# Patient Record
Sex: Male | Born: 1970 | Race: White | Hispanic: No | Marital: Married | Smoking: Current every day smoker
Health system: Southern US, Community
[De-identification: ages and names within clinical notes are randomized; demographics above are authoritative.]

## PROBLEM LIST (undated history)

## (undated) DIAGNOSIS — K219 Gastro-esophageal reflux disease without esophagitis: Secondary | ICD-10-CM

## (undated) DIAGNOSIS — M659 Synovitis and tenosynovitis, unspecified: Secondary | ICD-10-CM

## (undated) DIAGNOSIS — F329 Major depressive disorder, single episode, unspecified: Secondary | ICD-10-CM

## (undated) DIAGNOSIS — F32A Depression, unspecified: Secondary | ICD-10-CM

## (undated) DIAGNOSIS — R569 Unspecified convulsions: Secondary | ICD-10-CM

## (undated) DIAGNOSIS — M65972 Unspecified synovitis and tenosynovitis, left ankle and foot: Secondary | ICD-10-CM

## (undated) DIAGNOSIS — F419 Anxiety disorder, unspecified: Secondary | ICD-10-CM

## (undated) HISTORY — PX: APPENDECTOMY: SHX54

## (undated) HISTORY — PX: KNEE ARTHROSCOPY: SUR90

## (undated) HISTORY — PX: LIGAMENT REPAIR: SHX5444

## (undated) HISTORY — PX: OTHER SURGICAL HISTORY: SHX169

---

## 1998-07-19 ENCOUNTER — Emergency Department (HOSPITAL_COMMUNITY): Admission: EM | Admit: 1998-07-19 | Discharge: 1998-07-19 | Payer: Self-pay | Admitting: Emergency Medicine

## 1998-07-23 ENCOUNTER — Encounter: Admission: RE | Admit: 1998-07-23 | Discharge: 1998-10-21 | Payer: Self-pay | Admitting: Internal Medicine

## 2000-12-24 ENCOUNTER — Ambulatory Visit (HOSPITAL_BASED_OUTPATIENT_CLINIC_OR_DEPARTMENT_OTHER): Admission: RE | Admit: 2000-12-24 | Discharge: 2000-12-24 | Payer: Self-pay | Admitting: Orthopedic Surgery

## 2003-05-08 ENCOUNTER — Encounter: Payer: Self-pay | Admitting: Internal Medicine

## 2003-05-08 ENCOUNTER — Ambulatory Visit (HOSPITAL_COMMUNITY): Admission: RE | Admit: 2003-05-08 | Discharge: 2003-05-08 | Payer: Self-pay | Admitting: Internal Medicine

## 2007-05-23 ENCOUNTER — Ambulatory Visit (HOSPITAL_COMMUNITY): Admission: RE | Admit: 2007-05-23 | Discharge: 2007-05-23 | Payer: Self-pay | Admitting: Internal Medicine

## 2011-05-05 NOTE — Procedures (Signed)
NAME:  AD, GUTTMAN NO.:  [id number]   MEDICAL RECORD NO.:  [id number]          PATIENT TYPE:  OUT   LOCATION:  [facility]                          FACILITY:  [facility]   PHYSICIAN:  Kingsley Callander. Ouida Sills, MD       DATE OF BIRTH:  11-20-1971   DATE OF PROCEDURE:  05/23/2007  DATE OF DISCHARGE:                                  STRESS TEST   The patient underwent an exercise stress test to evaluate recent  symptoms of chest pain.  He exercised 9 minutes (completing stage III of  the Bruce protocol) attaining a maximal heart rate of 169 (91% of the  age-predicted maximal heart rate) and a workload of 10.1 mets and  discontinued exercise due to tired legs.  There were no symptoms of  chest pain.  There were occasional PVCs.  There were no ST-segment  changes diagnostic of ischemia.  The baseline electrocardiogram revealed  normal sinus rhythm at 84 beats per minute.   IMPRESSION:  No evidence of exercise-induced ischemia.      Kingsley Callander. Ouida Sills, MD  Electronically Signed     ROF/MEDQ  D:  05/23/2007  T:  05/23/2007  Job:  [id number]

## 2011-05-08 NOTE — Op Note (Signed)
[facility]  Patient:    Jimmy Sharp, Jimmy Sharp                     MRN: [id number] Proc. Date: 12/24/00 Adm. Date:  [id number] Attending:  Ronne Binning                           Operative Report  PREOPERATIVE DIAGNOSIS:  Mass, right middle finger.  OPERATION:  Excision of loose body of right middle finger, metacarpophalangeal joint.  POSTOPERATIVE DIAGNOSIS:  Mass, right middle finger.  SURGEON:  Nicki Reaper, M.D.  ASSISTANT:  None.  ANESTHESIA:  Forearm base IV regional.  ANESTHESIOLOGIST:  Halford Decamp, M.D.  HISTORY:  The patient is a 40 year old male with a history of a mass on the metacarpophalangeal joint dorsally of his right middle finger.  DESCRIPTION OF PROCEDURE:  The patient was brought to the operating room where a forearm base IV regional anesthetic was carried out without difficulty.  He was prepped and draped using Betadine scrub and solution with the right arm draped.  A curvilinear incision was made over the mass and carried down through subcutaneous tissue.  Bleeders were electrocauterized.  The mass was beneath the extensor tendon.  The sagittal fibers on the radial side were split transversely, separating the fibers.  The joint was opened.  A large, loose body was immediately identified and removed without difficulty.  The area was irrigated, and no further loose fragments were identified.  The capsule was closed with interrupted figure-of-eight 4-0 Vicryl sutures.  The extensor tendon sagittal fibers were repaired with 4-0 Vicryl and the skin with interrupted 5-0 nylon sutures.  A sterile bulky compressive dressing was applied.  The patient tolerated the procedure well and was taken to the recovery room for observation in satisfactory condition.  He was discharged home to return to the [facility] [place] in one week on Vicodin and Keflex.  He was advised not to drive while on the Vicodin. DD:   12/24/00 TD:  12/24/00 Job: [id number]

## 2012-02-01 ENCOUNTER — Other Ambulatory Visit (HOSPITAL_COMMUNITY): Payer: Self-pay | Admitting: Urology

## 2012-02-01 DIAGNOSIS — IMO0002 Reserved for concepts with insufficient information to code with codable children: Secondary | ICD-10-CM

## 2012-02-04 ENCOUNTER — Ambulatory Visit (HOSPITAL_COMMUNITY)
Admission: RE | Admit: 2012-02-04 | Discharge: 2012-02-04 | Disposition: A | Discharge status: Home / Self Care | Payer: 59 | Source: Ambulatory Visit | Attending: Urology | Admitting: Urology

## 2012-02-04 ENCOUNTER — Other Ambulatory Visit (HOSPITAL_COMMUNITY): Payer: Self-pay | Admitting: Urology

## 2012-02-04 DIAGNOSIS — N453 Epididymo-orchitis: Secondary | ICD-10-CM | POA: Insufficient documentation

## 2012-02-04 DIAGNOSIS — N509 Disorder of male genital organs, unspecified: Secondary | ICD-10-CM | POA: Insufficient documentation

## 2012-02-04 DIAGNOSIS — N433 Hydrocele, unspecified: Secondary | ICD-10-CM | POA: Insufficient documentation

## 2012-02-04 DIAGNOSIS — IMO0002 Reserved for concepts with insufficient information to code with codable children: Secondary | ICD-10-CM

## 2012-05-30 DIAGNOSIS — N50819 Testicular pain, unspecified: Secondary | ICD-10-CM | POA: Insufficient documentation

## 2012-06-24 DIAGNOSIS — Z789 Other specified health status: Secondary | ICD-10-CM | POA: Insufficient documentation

## 2012-06-24 DIAGNOSIS — E559 Vitamin D deficiency, unspecified: Secondary | ICD-10-CM | POA: Insufficient documentation

## 2012-06-24 DIAGNOSIS — E538 Deficiency of other specified B group vitamins: Secondary | ICD-10-CM | POA: Insufficient documentation

## 2015-01-17 DIAGNOSIS — M25879 Other specified joint disorders, unspecified ankle and foot: Secondary | ICD-10-CM | POA: Insufficient documentation

## 2015-01-17 DIAGNOSIS — M899 Disorder of bone, unspecified: Secondary | ICD-10-CM | POA: Insufficient documentation

## 2015-04-07 ENCOUNTER — Emergency Department (HOSPITAL_COMMUNITY)
Admission: EM | Admit: 2015-04-07 | Discharge: 2015-04-07 | Disposition: A | Discharge status: Home / Self Care | Payer: 59 | Attending: Emergency Medicine | Admitting: Emergency Medicine

## 2015-04-07 ENCOUNTER — Encounter (HOSPITAL_COMMUNITY): Payer: Self-pay | Admitting: *Deleted

## 2015-04-07 ENCOUNTER — Emergency Department (HOSPITAL_COMMUNITY): Payer: 59

## 2015-04-07 DIAGNOSIS — Z87891 Personal history of nicotine dependence: Secondary | ICD-10-CM | POA: Diagnosis not present

## 2015-04-07 DIAGNOSIS — R000 Tachycardia, unspecified: Secondary | ICD-10-CM | POA: Diagnosis not present

## 2015-04-07 DIAGNOSIS — Z79899 Other long term (current) drug therapy: Secondary | ICD-10-CM | POA: Diagnosis not present

## 2015-04-07 DIAGNOSIS — R569 Unspecified convulsions: Secondary | ICD-10-CM | POA: Insufficient documentation

## 2015-04-07 DIAGNOSIS — R51 Headache: Secondary | ICD-10-CM | POA: Insufficient documentation

## 2015-04-07 DIAGNOSIS — F1310 Sedative, hypnotic or anxiolytic abuse, uncomplicated: Secondary | ICD-10-CM | POA: Insufficient documentation

## 2015-04-07 LAB — COMPREHENSIVE METABOLIC PANEL
ALBUMIN: 4.2 g/dL (ref 3.5–5.2)
ALT: 29 U/L (ref 0–53)
AST: 28 U/L (ref 0–37)
Alkaline Phosphatase: 75 U/L (ref 39–117)
Anion gap: 12 (ref 5–15)
BILIRUBIN TOTAL: 0.7 mg/dL (ref 0.3–1.2)
BUN: 12 mg/dL (ref 6–23)
CHLORIDE: 103 mmol/L (ref 96–112)
CO2: 22 mmol/L (ref 19–32)
CREATININE: 1.28 mg/dL (ref 0.50–1.35)
Calcium: 9.3 mg/dL (ref 8.4–10.5)
GFR calc non Af Amer: 67 mL/min — ABNORMAL LOW (ref >90)
GFR, EST AFRICAN AMERICAN: 78 mL/min — AB (ref >90)
Glucose, Bld: 79 mg/dL (ref 70–99)
POTASSIUM: 3.2 mmol/L — AB (ref 3.5–5.1)
SODIUM: 137 mmol/L (ref 135–145)
Total Protein: 7.5 g/dL (ref 6.0–8.3)

## 2015-04-07 LAB — RAPID URINE DRUG SCREEN, HOSP PERFORMED
Amphetamines: NOT DETECTED
BENZODIAZEPINES: POSITIVE — AB
Barbiturates: NOT DETECTED
Cocaine: NOT DETECTED
Opiates: NOT DETECTED
Tetrahydrocannabinol: NOT DETECTED

## 2015-04-07 LAB — CBC WITH DIFFERENTIAL/PLATELET
BASOS ABS: 0 10*3/uL (ref 0.0–0.1)
Basophils Relative: 0 % (ref 0–1)
EOS ABS: 0.1 10*3/uL (ref 0.0–0.7)
EOS PCT: 0 % (ref 0–5)
HCT: 46.4 % (ref 39.0–52.0)
HEMOGLOBIN: 16.1 g/dL (ref 13.0–17.0)
Lymphocytes Relative: 16 % (ref 12–46)
Lymphs Abs: 1.9 10*3/uL (ref 0.7–4.0)
MCH: 31.8 pg (ref 26.0–34.0)
MCHC: 34.7 g/dL (ref 30.0–36.0)
MCV: 91.5 fL (ref 78.0–100.0)
MONO ABS: 0.6 10*3/uL (ref 0.1–1.0)
MONOS PCT: 5 % (ref 3–12)
Neutro Abs: 9.1 10*3/uL — ABNORMAL HIGH (ref 1.7–7.7)
Neutrophils Relative %: 79 % — ABNORMAL HIGH (ref 43–77)
Platelets: 279 10*3/uL (ref 150–400)
RBC: 5.07 MIL/uL (ref 4.22–5.81)
RDW: 12.3 % (ref 11.5–15.5)
WBC: 11.7 10*3/uL — ABNORMAL HIGH (ref 4.0–10.5)

## 2015-04-07 LAB — ETHANOL

## 2015-04-07 MED ORDER — CLONIDINE HCL 0.1 MG PO TABS: 0.1000 mg | ORAL_TABLET | Freq: Three times a day (TID) | ORAL | Status: DC | PRN

## 2015-04-07 MED ORDER — DICYCLOMINE HCL 20 MG PO TABS: 20.0000 mg | ORAL_TABLET | Freq: Two times a day (BID) | ORAL | Status: DC | PRN

## 2015-04-07 MED ORDER — LORAZEPAM 2 MG/ML IJ SOLN
1.0000 mg | Freq: Once | INTRAMUSCULAR | Status: AC
  Administered 2015-04-07: 1 mg via INTRAVENOUS
  Filled 2015-04-07: qty 1

## 2015-04-07 MED ORDER — PROMETHAZINE HCL 25 MG PO TABS: 25.0000 mg | ORAL_TABLET | Freq: Four times a day (QID) | ORAL | Status: DC | PRN

## 2015-04-07 NOTE — Discharge Instructions (Signed)

## 2015-04-07 NOTE — ED Notes (Signed)
Pt alert & oriented x4, stable gait. Patient  given discharge instructions, paperwork & prescription(s). Patient verbalized understanding. Pt left department w/ no further questions. 

## 2015-04-07 NOTE — ED Notes (Signed)
Pt brought in by [facility] for c/o seizure; [facility] states wife told them that pt was sitting at computer and starting to staring off then he was helped to couch and states pt was unconscious x 2 mins; when ems arrived pt was found to be very diaphoretic and c/o having "fuzzy" feeling in the head; pt is alert and responsive upon arrival; pt was given zofran 4mg  en route

## 2015-04-07 NOTE — ED Notes (Signed)
Pt had a seizure last year around the same time but did not report to his PCP

## 2015-04-07 NOTE — ED Notes (Signed)
Pt arrived by EMS from home. Was given 4 zofran in route. Pt states had a seizure about a year ago.

## 2015-04-07 NOTE — ED Provider Notes (Signed)
CSN: [id number]     Arrival date & time 04/07/15  1923 History   First MD Initiated Contact with Patient 04/07/15 1926     Chief Complaint  Patient presents with  . Seizures     (Consider location/radiation/quality/duration/timing/severity/associated sxs/prior Treatment) Patient is a 44 y.o. male presenting with seizures. The history is provided by the patient.  Seizures Seizure activity on arrival: no    patient presents after seizure activity. Witnessed by wife. Followed by confusion. Around 9 months ago had another episode where he had been less responsive. He quickly aroused after that. Patient does not have a known seizure disorder. 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 He states he is taking them as prescribed. He also is on Xanax that he takes at night. He has been sleeping well. Denies substance abuse. No other confusion except for the post ictal time. He's had a slight dull headache. He is a former smoker.  History reviewed. No pertinent past medical history. Past Surgical History  Procedure Laterality Date  . Ligament repair Left     left ankle  . Knuckle surgery Right   . Knee arthroscopy Left    History reviewed. No pertinent family history. History  Substance Use Topics  . Smoking status: Former Games developermoker  . Smokeless tobacco: Current User    Types: Chew  . Alcohol Use: No    Review of Systems  Constitutional: Negative for activity change and appetite change.  Eyes: Negative for pain.  Respiratory: Negative for chest tightness and shortness of breath.   Cardiovascular: Negative for chest pain and leg swelling.  Gastrointestinal: Negative for nausea, vomiting, abdominal pain and diarrhea.  Genitourinary: Negative for flank pain.  Musculoskeletal: Negative for back pain and neck stiffness.  Skin: Negative for rash and wound.  Neurological: Positive for seizures and headaches. Negative for weakness and numbness.  Psychiatric/Behavioral: Negative for  behavioral problems.      Allergies  Review of patient's allergies indicates no known allergies.  Home Medications   Prior to Admission medications   Medication Sig Start Date End Date Taking? Authorizing Provider  ALPRAZolam Prudy Feeler(XANAX) 0.5 MG tablet Take 0.5 mg by mouth 3 (three) times daily as needed for anxiety.   Yes Historical Provider, MD  amitriptyline (ELAVIL) 50 MG tablet Take 50 mg by mouth at bedtime.   Yes Historical Provider, MD  Aspirin-Acetaminophen-Caffeine (GOODY HEADACHE PO) Take 1 packet by mouth daily as needed (for pain).   Yes Historical Provider, MD  oxymetazoline (AFRIN) 0.05 % nasal spray Place 1 spray into both nostrils 2 (two) times daily.   Yes Historical Provider, MD  cloNIDine (CATAPRES) 0.1 MG tablet Take 1 tablet (0.1 mg total) by mouth 3 (three) times daily as needed (tid prn for 2 days then bid for 2 days, then q day for 2 days). 04/07/15   Benjiman CoreNathan Alyna Stensland, MD  dicyclomine (BENTYL) 20 MG tablet Take 1 tablet (20 mg total) by mouth 2 (two) times daily as needed for spasms. 04/07/15   Benjiman CoreNathan Alexi Geibel, MD  promethazine (PHENERGAN) 25 MG tablet Take 1 tablet (25 mg total) by mouth every 6 (six) hours as needed for nausea. 04/07/15   Benjiman CoreNathan Satoya Feeley, MD   BP 136/97 mmHg  Pulse 111  Temp(Src) 98.2 F (36.8 C) (Oral)  Resp 13  Ht 6' (1.829 m)  Wt 270 lb (122.471 kg)  BMI 36.61 kg/m2  SpO2 98% Physical Exam  Constitutional: He is oriented to person, place, and time. He appears well-developed  and well-nourished.  HENT:  Head: Normocephalic and atraumatic.  Eyes: Pupils are equal, round, and reactive to light.  Neck: Normal range of motion.  Cardiovascular: Regular rhythm and normal heart sounds.   No murmur heard. Mild tachycardia  Pulmonary/Chest: Effort normal and breath sounds normal.  Abdominal: Soft. Bowel sounds are normal. He exhibits no distension and no mass. There is no tenderness. There is no rebound and no guarding.  Musculoskeletal: Normal  range of motion. He exhibits no edema.  Neurological: He is alert and oriented to person, place, and time. No cranial nerve deficit.  Skin: Skin is warm and dry.  Psychiatric: He has a normal mood and affect.  Nursing note and vitals reviewed.   ED Course  Procedures (including critical care time) Labs Review Labs Reviewed  COMPREHENSIVE METABOLIC PANEL - Abnormal; Notable for the following:    Potassium 3.2 (*)    GFR calc non Af Amer 67 (*)    GFR calc Af Amer 78 (*)    All other components within normal limits  URINE RAPID DRUG SCREEN (HOSP PERFORMED) - Abnormal; Notable for the following:    Benzodiazepines POSITIVE (*)    All other components within normal limits  CBC WITH DIFFERENTIAL/PLATELET - Abnormal; Notable for the following:    WBC 11.7 (*)    Neutrophils Relative % 79 (*)    Neutro Abs 9.1 (*)    All other components within normal limits  ETHANOL    Imaging Review Ct Head Wo Contrast  04/07/2015   CLINICAL DATA:  Altered mental status and episode of unconsciousness today.  EXAM: CT HEAD WITHOUT CONTRAST  TECHNIQUE: Contiguous axial images were obtained from the base of the skull through the vertex without intravenous contrast.  COMPARISON:  None.  FINDINGS: The brain appears normal without hemorrhage, infarct, mass lesion, mass effect, midline shift or abnormal extra-axial fluid collection. No hydrocephalus or pneumocephalus. Mild mucosal thickening is seen in the right sphenoid sinus.  IMPRESSION: No acute abnormality.  Mild right sphenoid sinus disease.   Electronically Signed   By: Drusilla Kanner M.D.   On: 04/07/2015 20:32     EKG Interpretation   Date/Time:  Sunday April 07 2015 19:30:57 EDT Ventricular Rate:  112 PR Interval:  148 QRS Duration: 96 QT Interval:  325 QTC Calculation: 444 R Axis:   52 Text Interpretation:  Sinus tachycardia Confirmed by Rubin Payor  MD, Harrold Donath  [phone]) on 04/07/2015 9:34:23 PM      MDM   Final diagnoses:  Seizure     Patient with new onset seizure.lab work overall reassuring. Head CT reassuring. Back at baseline. Will discharge home. Will follow with neurology. Patient has been on Ultram which I will start since it can lower seizure threshold. Will give Catapres to help with the detox.    Benjiman Core, MD 04/07/15 2135

## 2015-04-09 ENCOUNTER — Other Ambulatory Visit: Payer: Self-pay | Admitting: Neurology

## 2015-04-09 DIAGNOSIS — R569 Unspecified convulsions: Secondary | ICD-10-CM

## 2015-04-14 ENCOUNTER — Encounter (HOSPITAL_COMMUNITY): Payer: Self-pay | Admitting: Emergency Medicine

## 2015-04-14 ENCOUNTER — Emergency Department (HOSPITAL_COMMUNITY): Payer: 59

## 2015-04-14 ENCOUNTER — Observation Stay (HOSPITAL_COMMUNITY)
Admission: EM | Admit: 2015-04-14 | Discharge: 2015-04-14 | Disposition: A | Discharge status: Home / Self Care | Payer: 59 | Attending: General Surgery | Admitting: General Surgery

## 2015-04-14 ENCOUNTER — Emergency Department (HOSPITAL_COMMUNITY): Payer: 59 | Admitting: Anesthesiology

## 2015-04-14 ENCOUNTER — Encounter (HOSPITAL_COMMUNITY)
Admission: EM | Disposition: A | Discharge status: Home / Self Care | Payer: Self-pay | Source: Home / Self Care | Attending: Emergency Medicine

## 2015-04-14 DIAGNOSIS — Z7982 Long term (current) use of aspirin: Secondary | ICD-10-CM | POA: Diagnosis not present

## 2015-04-14 DIAGNOSIS — F17220 Nicotine dependence, chewing tobacco, uncomplicated: Secondary | ICD-10-CM | POA: Insufficient documentation

## 2015-04-14 DIAGNOSIS — K3580 Unspecified acute appendicitis: Secondary | ICD-10-CM | POA: Diagnosis not present

## 2015-04-14 DIAGNOSIS — K4020 Bilateral inguinal hernia, without obstruction or gangrene, not specified as recurrent: Secondary | ICD-10-CM | POA: Diagnosis not present

## 2015-04-14 DIAGNOSIS — R109 Unspecified abdominal pain: Secondary | ICD-10-CM

## 2015-04-14 DIAGNOSIS — N200 Calculus of kidney: Secondary | ICD-10-CM | POA: Insufficient documentation

## 2015-04-14 DIAGNOSIS — R1030 Lower abdominal pain, unspecified: Secondary | ICD-10-CM

## 2015-04-14 DIAGNOSIS — Z79899 Other long term (current) drug therapy: Secondary | ICD-10-CM | POA: Insufficient documentation

## 2015-04-14 DIAGNOSIS — R1031 Right lower quadrant pain: Secondary | ICD-10-CM | POA: Diagnosis present

## 2015-04-14 HISTORY — PX: LAPAROSCOPIC APPENDECTOMY: SHX408

## 2015-04-14 LAB — COMPREHENSIVE METABOLIC PANEL
ALT: 31 U/L (ref 0–53)
ANION GAP: 9 (ref 5–15)
AST: 28 U/L (ref 0–37)
Albumin: 4.6 g/dL (ref 3.5–5.2)
Alkaline Phosphatase: 69 U/L (ref 39–117)
BUN: 12 mg/dL (ref 6–23)
CO2: 26 mmol/L (ref 19–32)
Calcium: 9.6 mg/dL (ref 8.4–10.5)
Chloride: 106 mmol/L (ref 96–112)
Creatinine, Ser: 1.02 mg/dL (ref 0.50–1.35)
GFR, EST NON AFRICAN AMERICAN: 88 mL/min — AB (ref >90)
Glucose, Bld: 107 mg/dL — ABNORMAL HIGH (ref 70–99)
Potassium: 3.6 mmol/L (ref 3.5–5.1)
Sodium: 141 mmol/L (ref 135–145)
Total Bilirubin: 0.5 mg/dL (ref 0.3–1.2)
Total Protein: 7.9 g/dL (ref 6.0–8.3)

## 2015-04-14 LAB — CBC WITH DIFFERENTIAL/PLATELET
BASOS PCT: 0 % (ref 0–1)
Basophils Absolute: 0 10*3/uL (ref 0.0–0.1)
EOS PCT: 0 % (ref 0–5)
Eosinophils Absolute: 0 10*3/uL (ref 0.0–0.7)
HEMATOCRIT: 48.1 % (ref 39.0–52.0)
Hemoglobin: 16.6 g/dL (ref 13.0–17.0)
LYMPHS ABS: 1.1 10*3/uL (ref 0.7–4.0)
Lymphocytes Relative: 8 % — ABNORMAL LOW (ref 12–46)
MCH: 31.6 pg (ref 26.0–34.0)
MCHC: 34.5 g/dL (ref 30.0–36.0)
MCV: 91.6 fL (ref 78.0–100.0)
Monocytes Absolute: 0.4 10*3/uL (ref 0.1–1.0)
Monocytes Relative: 3 % (ref 3–12)
Neutro Abs: 12.7 10*3/uL — ABNORMAL HIGH (ref 1.7–7.7)
Neutrophils Relative %: 89 % — ABNORMAL HIGH (ref 43–77)
Platelets: 350 10*3/uL (ref 150–400)
RBC: 5.25 MIL/uL (ref 4.22–5.81)
RDW: 12.5 % (ref 11.5–15.5)
WBC: 14.2 10*3/uL — AB (ref 4.0–10.5)

## 2015-04-14 LAB — URINALYSIS, ROUTINE W REFLEX MICROSCOPIC
Bilirubin Urine: NEGATIVE
Glucose, UA: NEGATIVE mg/dL
Hgb urine dipstick: NEGATIVE
Ketones, ur: NEGATIVE mg/dL
LEUKOCYTES UA: NEGATIVE
Nitrite: NEGATIVE
Protein, ur: NEGATIVE mg/dL
Specific Gravity, Urine: 1.015 (ref 1.005–1.030)
Urobilinogen, UA: 0.2 mg/dL (ref 0.0–1.0)
pH: 8.5 — ABNORMAL HIGH (ref 5.0–8.0)

## 2015-04-14 LAB — I-STAT CG4 LACTIC ACID, ED: LACTIC ACID, VENOUS: 2.11 mmol/L — AB (ref 0.5–2.0)

## 2015-04-14 LAB — LIPASE, BLOOD: Lipase: 31 U/L (ref 11–59)

## 2015-04-14 SURGERY — APPENDECTOMY, LAPAROSCOPIC
Anesthesia: General

## 2015-04-14 MED ORDER — POVIDONE-IODINE 10 % EX OINT
TOPICAL_OINTMENT | CUTANEOUS | Status: AC
  Filled 2015-04-14: qty 1

## 2015-04-14 MED ORDER — PROPOFOL 10 MG/ML IV BOLUS
INTRAVENOUS | Status: DC | PRN
  Administered 2015-04-14: 300 mg via INTRAVENOUS

## 2015-04-14 MED ORDER — ONDANSETRON HCL 4 MG PO TABS: 4.0000 mg | ORAL_TABLET | Freq: Four times a day (QID) | ORAL | Status: DC | PRN

## 2015-04-14 MED ORDER — TOPIRAMATE 25 MG PO CPSP
25.0000 mg | ORAL_CAPSULE | Freq: Every day | ORAL | Status: DC
  Filled 2015-04-14 (×2): qty 1

## 2015-04-14 MED ORDER — CLONIDINE HCL 0.1 MG PO TABS: 0.1000 mg | ORAL_TABLET | Freq: Three times a day (TID) | ORAL | Status: DC | PRN

## 2015-04-14 MED ORDER — OXYCODONE-ACETAMINOPHEN 5-325 MG PO TABS: 1.0000 | ORAL_TABLET | ORAL | Status: DC | PRN

## 2015-04-14 MED ORDER — FENTANYL CITRATE (PF) 100 MCG/2ML IJ SOLN
100.0000 ug | Freq: Once | INTRAMUSCULAR | Status: AC
  Administered 2015-04-14: 100 ug via INTRAVENOUS
  Filled 2015-04-14: qty 2

## 2015-04-14 MED ORDER — PANTOPRAZOLE SODIUM 40 MG PO TBEC: 40.0000 mg | DELAYED_RELEASE_TABLET | Freq: Every day | ORAL | Status: DC

## 2015-04-14 MED ORDER — ALPRAZOLAM 0.5 MG PO TABS: 0.5000 mg | ORAL_TABLET | Freq: Three times a day (TID) | ORAL | Status: DC | PRN

## 2015-04-14 MED ORDER — OXYCODONE-ACETAMINOPHEN 7.5-325 MG PO TABS: 1.0000 | ORAL_TABLET | ORAL | Status: DC | PRN

## 2015-04-14 MED ORDER — LORAZEPAM 2 MG/ML IJ SOLN: 1.0000 mg | INTRAMUSCULAR | Status: DC | PRN

## 2015-04-14 MED ORDER — SODIUM CHLORIDE 0.9 % IV SOLN
3.0000 g | Freq: Once | INTRAVENOUS | Status: AC
  Administered 2015-04-14: 3 g via INTRAVENOUS
  Filled 2015-04-14: qty 3

## 2015-04-14 MED ORDER — KETOROLAC TROMETHAMINE 30 MG/ML IJ SOLN
30.0000 mg | Freq: Once | INTRAMUSCULAR | Status: AC
  Administered 2015-04-14: 30 mg via INTRAVENOUS

## 2015-04-14 MED ORDER — ONDANSETRON HCL 4 MG/2ML IJ SOLN: 4.0000 mg | Freq: Four times a day (QID) | INTRAMUSCULAR | Status: DC | PRN

## 2015-04-14 MED ORDER — IOHEXOL 300 MG/ML  SOLN
25.0000 mL | Freq: Once | INTRAMUSCULAR | Status: AC | PRN
  Administered 2015-04-14: 25 mL via ORAL

## 2015-04-14 MED ORDER — SODIUM CHLORIDE 0.9 % IV SOLN
Freq: Once | INTRAVENOUS | Status: AC
  Administered 2015-04-14: 08:00:00 via INTRAVENOUS

## 2015-04-14 MED ORDER — GLYCOPYRROLATE 0.2 MG/ML IJ SOLN
INTRAMUSCULAR | Status: DC | PRN
  Administered 2015-04-14: .9 mg via INTRAVENOUS

## 2015-04-14 MED ORDER — LIDOCAINE HCL (PF) 1 % IJ SOLN
INTRAMUSCULAR | Status: AC
  Filled 2015-04-14: qty 5

## 2015-04-14 MED ORDER — FENTANYL CITRATE (PF) 250 MCG/5ML IJ SOLN
INTRAMUSCULAR | Status: AC
  Filled 2015-04-14: qty 5

## 2015-04-14 MED ORDER — LACTATED RINGERS IV SOLN
INTRAVENOUS | Status: DC | PRN
  Administered 2015-04-14: 15:00:00 via INTRAVENOUS

## 2015-04-14 MED ORDER — SODIUM CHLORIDE 0.9 % IR SOLN
Status: DC | PRN
  Administered 2015-04-14: 1000 mL

## 2015-04-14 MED ORDER — ROCURONIUM BROMIDE 100 MG/10ML IV SOLN
INTRAVENOUS | Status: DC | PRN
  Administered 2015-04-14: 30 mg via INTRAVENOUS
  Administered 2015-04-14: 10 mg via INTRAVENOUS

## 2015-04-14 MED ORDER — IOHEXOL 300 MG/ML  SOLN
100.0000 mL | Freq: Once | INTRAMUSCULAR | Status: AC | PRN
  Administered 2015-04-14: 100 mL via INTRAVENOUS

## 2015-04-14 MED ORDER — SUCCINYLCHOLINE CHLORIDE 20 MG/ML IJ SOLN
INTRAMUSCULAR | Status: DC | PRN
  Administered 2015-04-14: 140 mg via INTRAVENOUS

## 2015-04-14 MED ORDER — ONDANSETRON HCL 4 MG/2ML IJ SOLN
INTRAMUSCULAR | Status: AC
  Filled 2015-04-14: qty 2

## 2015-04-14 MED ORDER — MIDAZOLAM HCL 2 MG/2ML IJ SOLN
INTRAMUSCULAR | Status: AC
  Filled 2015-04-14: qty 2

## 2015-04-14 MED ORDER — NEOSTIGMINE METHYLSULFATE 10 MG/10ML IV SOLN
INTRAVENOUS | Status: DC | PRN
  Administered 2015-04-14: 5 mg via INTRAVENOUS

## 2015-04-14 MED ORDER — ONDANSETRON HCL 4 MG/2ML IJ SOLN
INTRAMUSCULAR | Status: DC | PRN
  Administered 2015-04-14: 4 mg via INTRAVENOUS

## 2015-04-14 MED ORDER — BUPIVACAINE HCL (PF) 0.5 % IJ SOLN
INTRAMUSCULAR | Status: AC
  Filled 2015-04-14: qty 30

## 2015-04-14 MED ORDER — HYDROMORPHONE HCL 1 MG/ML IJ SOLN
1.0000 mg | Freq: Once | INTRAMUSCULAR | Status: AC
  Administered 2015-04-14: 1 mg via INTRAVENOUS
  Filled 2015-04-14: qty 1

## 2015-04-14 MED ORDER — BUPIVACAINE HCL (PF) 0.5 % IJ SOLN
INTRAMUSCULAR | Status: DC | PRN
  Administered 2015-04-14: 10 mL

## 2015-04-14 MED ORDER — SUCCINYLCHOLINE CHLORIDE 20 MG/ML IJ SOLN
INTRAMUSCULAR | Status: AC
  Filled 2015-04-14: qty 1

## 2015-04-14 MED ORDER — PROPOFOL 10 MG/ML IV BOLUS
INTRAVENOUS | Status: AC
  Filled 2015-04-14: qty 20

## 2015-04-14 MED ORDER — SODIUM CHLORIDE 0.9 % IV SOLN
3.0000 g | Freq: Four times a day (QID) | INTRAVENOUS | Status: DC
  Filled 2015-04-14 (×3): qty 3

## 2015-04-14 MED ORDER — KETOROLAC TROMETHAMINE 30 MG/ML IJ SOLN
INTRAMUSCULAR | Status: AC
  Filled 2015-04-14: qty 1

## 2015-04-14 MED ORDER — LACTATED RINGERS IV SOLN
INTRAVENOUS | Status: DC
  Administered 2015-04-14: 100 mL via INTRAVENOUS

## 2015-04-14 MED ORDER — POVIDONE-IODINE 10 % OINT PACKET
TOPICAL_OINTMENT | CUTANEOUS | Status: DC | PRN
  Administered 2015-04-14: 1 via TOPICAL

## 2015-04-14 MED ORDER — HYDROMORPHONE HCL 1 MG/ML IJ SOLN
1.0000 mg | INTRAMUSCULAR | Status: DC | PRN
  Administered 2015-04-14: 1 mg via INTRAVENOUS
  Filled 2015-04-14: qty 1

## 2015-04-14 MED ORDER — MIDAZOLAM HCL 5 MG/5ML IJ SOLN
INTRAMUSCULAR | Status: DC | PRN
  Administered 2015-04-14 (×3): 2 mg via INTRAVENOUS

## 2015-04-14 MED ORDER — LIDOCAINE HCL (CARDIAC) 20 MG/ML IV SOLN
INTRAVENOUS | Status: DC | PRN
  Administered 2015-04-14: 50 mg via INTRAVENOUS

## 2015-04-14 MED ORDER — ROCURONIUM BROMIDE 50 MG/5ML IV SOLN
INTRAVENOUS | Status: AC
  Filled 2015-04-14: qty 1

## 2015-04-14 MED ORDER — FENTANYL CITRATE (PF) 100 MCG/2ML IJ SOLN
INTRAMUSCULAR | Status: DC | PRN
  Administered 2015-04-14 (×5): 50 ug via INTRAVENOUS
  Administered 2015-04-14: 100 ug via INTRAVENOUS
  Administered 2015-04-14 (×3): 50 ug via INTRAVENOUS

## 2015-04-14 MED ORDER — SODIUM CHLORIDE 0.9 % IV BOLUS (SEPSIS)
1000.0000 mL | Freq: Once | INTRAVENOUS | Status: AC
Start: 2015-04-14 — End: 2015-04-14
  Administered 2015-04-14: 1000 mL via INTRAVENOUS

## 2015-04-14 MED ORDER — ONDANSETRON HCL 4 MG/2ML IJ SOLN
4.0000 mg | Freq: Once | INTRAMUSCULAR | Status: AC
Start: 2015-04-14 — End: 2015-04-14
  Administered 2015-04-14: 4 mg via INTRAVENOUS
  Filled 2015-04-14: qty 2

## 2015-04-14 SURGICAL SUPPLY — 43 items
BAG HAMPER (MISCELLANEOUS) ×3 IMPLANT
BAG SPEC RTRVL LRG 6X4 10 (ENDOMECHANICALS) ×1
CHLORAPREP W/TINT 26ML (MISCELLANEOUS) ×3 IMPLANT
CLOTH BEACON ORANGE TIMEOUT ST (SAFETY) ×3 IMPLANT
COVER LIGHT HANDLE STERIS (MISCELLANEOUS) ×6 IMPLANT
CUTTER LINEAR ENDO 35 ETS (STAPLE) ×2 IMPLANT
DECANTER SPIKE VIAL GLASS SM (MISCELLANEOUS) ×3 IMPLANT
ELECT REM PT RETURN 9FT ADLT (ELECTROSURGICAL) ×3
ELECTRODE REM PT RTRN 9FT ADLT (ELECTROSURGICAL) ×1 IMPLANT
FILTER SMOKE EVAC LAPAROSHD (FILTER) ×3 IMPLANT
FORMALIN 10 PREFIL 120ML (MISCELLANEOUS) ×3 IMPLANT
GLOVE BIOGEL PI IND STRL 7.0 (GLOVE) IMPLANT
GLOVE BIOGEL PI IND STRL 7.5 (GLOVE) IMPLANT
GLOVE BIOGEL PI INDICATOR 7.0 (GLOVE) ×6
GLOVE BIOGEL PI INDICATOR 7.5 (GLOVE) ×2
GLOVE SURG SS PI 7.5 STRL IVOR (GLOVE) ×8 IMPLANT
GOWN STRL REUS W/ TWL XL LVL3 (GOWN DISPOSABLE) ×1 IMPLANT
GOWN STRL REUS W/TWL LRG LVL3 (GOWN DISPOSABLE) ×3 IMPLANT
GOWN STRL REUS W/TWL XL LVL3 (GOWN DISPOSABLE) ×3
INST SET LAPROSCOPIC AP (KITS) ×3 IMPLANT
KIT ROOM TURNOVER APOR (KITS) ×3 IMPLANT
MANIFOLD NEPTUNE II (INSTRUMENTS) ×3 IMPLANT
NDL INSUFFLATION 14GA 120MM (NEEDLE) ×1 IMPLANT
NEEDLE INSUFFLATION 14GA 120MM (NEEDLE) ×3 IMPLANT
NS IRRIG 1000ML POUR BTL (IV SOLUTION) ×3 IMPLANT
PACK LAP CHOLE LZT030E (CUSTOM PROCEDURE TRAY) ×3 IMPLANT
PAD ARMBOARD 7.5X6 YLW CONV (MISCELLANEOUS) ×3 IMPLANT
PENCIL HANDSWITCHING (ELECTRODE) ×2 IMPLANT
POUCH SPECIMEN RETRIEVAL 10MM (ENDOMECHANICALS) ×3 IMPLANT
SCALPEL HARMONIC ACE (MISCELLANEOUS) ×3 IMPLANT
SET BASIN LINEN APH (SET/KITS/TRAYS/PACK) ×3 IMPLANT
SPONGE GAUZE 2X2 8PLY STER LF (GAUZE/BANDAGES/DRESSINGS) ×3
SPONGE GAUZE 2X2 8PLY STRL LF (GAUZE/BANDAGES/DRESSINGS) ×6 IMPLANT
STAPLER VISISTAT (STAPLE) ×3 IMPLANT
SUT VICRYL 0 UR6 27IN ABS (SUTURE) ×3 IMPLANT
TAPE CLOTH SURG 4X10 WHT LF (GAUZE/BANDAGES/DRESSINGS) ×2 IMPLANT
TRAY FOLEY CATH 16FR SILVER (SET/KITS/TRAYS/PACK) ×3 IMPLANT
TROCAR ENDO BLADELESS 11MM (ENDOMECHANICALS) ×3 IMPLANT
TROCAR ENDO BLADELESS 12MM (ENDOMECHANICALS) ×3 IMPLANT
TROCAR XCEL NON-BLD 5MMX100MML (ENDOMECHANICALS) ×3 IMPLANT
TUBING INSUFFLATION (TUBING) ×3 IMPLANT
WARMER LAPAROSCOPE (MISCELLANEOUS) ×3 IMPLANT
YANKAUER SUCT 12FT TUBE ARGYLE (SUCTIONS) ×3 IMPLANT

## 2015-04-14 NOTE — Anesthesia Preprocedure Evaluation (Signed)
Anesthesia Evaluation  Patient identified by MRN, date of birth, ID band Patient awake    Reviewed: Allergy & Precautions, NPO status , Patient's Chart, lab work & pertinent test results  Airway Mallampati: I  TM Distance: >3 FB Neck ROM: Full    Dental  (+) Teeth Intact   Pulmonary Current Smoker, former smoker,  breath sounds clear to auscultation        Cardiovascular Exercise Tolerance: Good negative cardio ROS  Rhythm:Regular Rate:Normal     Neuro/Psych Seizures -,  Newly diagnosed and treatment started, 1 week    GI/Hepatic GERD-  Controlled,  Endo/Other    Renal/GU Kidney stones, asymptomatic     Musculoskeletal   Abdominal   Peds  Hematology   Anesthesia Other Findings   Reproductive/Obstetrics                             Anesthesia Physical Anesthesia Plan  ASA: II and emergent  Anesthesia Plan: General   Post-op Pain Management:    Induction: Rapid sequence and Intravenous  Airway Management Planned: Oral ETT  Additional Equipment:   Intra-op Plan:   Post-operative Plan: Extubation in OR  Informed Consent: I have reviewed the patients History and Physical, chart, labs and discussed the procedure including the risks, benefits and alternatives for the proposed anesthesia with the patient or authorized representative who has indicated his/her understanding and acceptance.   Dental advisory given  Plan Discussed with:   Anesthesia Plan Comments:         Anesthesia Quick Evaluation

## 2015-04-14 NOTE — Progress Notes (Signed)
Patient discharged with instructions, prescription, and care notes.  Verbalized understanding via teach back.  IV was removed and the site was WNL. Patient voiced no further complaints or concerns at the time of discharge.  Appointments scheduled per instructions.   

## 2015-04-14 NOTE — Discharge Instructions (Signed)
Laparoscopic Appendectomy °Care After °Refer to this sheet in the next few weeks. These instructions provide you with information on caring for yourself after your procedure. Your caregiver may also give you more specific instructions. Your treatment has been planned according to current medical practices, but problems sometimes occur. Call your caregiver if you have any problems or questions after your procedure. °HOME CARE INSTRUCTIONS °· Do not drive while taking narcotic pain medicines. °· Use stool softener if you become constipated from your pain medicines. °· Change your bandages (dressings) as directed. °· Keep your wounds clean and dry. You may wash the wounds gently with soap and water. Gently pat the wounds dry with a clean towel. °· Do not take baths, swim, or use hot tubs for 10 days, or as instructed by your caregiver. °· Only take over-the-counter or prescription medicines for pain, discomfort, or fever as directed by your caregiver. °· You may continue your normal diet as directed. °· Do not lift more than 10 pounds (4.5 kg) or play contact sports for 3 weeks, or as directed. °· Slowly increase your activity after surgery. °· Take deep breaths to avoid getting a lung infection (pneumonia). °SEEK MEDICAL CARE IF: °· You have redness, swelling, or increasing pain in your wounds. °· You have pus coming from your wounds. °· You have drainage from a wound that lasts longer than 1 day. °· You notice a bad smell coming from the wounds or dressing. °· Your wound edges break open after stitches (sutures) have been removed. °· You notice increasing pain in the shoulders (shoulder strap areas) or near your shoulder blades. °· You develop dizzy episodes or fainting while standing. °· You develop shortness of breath. °· You develop persistent nausea or vomiting. °· You cannot control your bowel functions or lose your appetite. °· You develop diarrhea. °SEEK IMMEDIATE MEDICAL CARE IF:  °· You have a fever. °· You  develop a rash. °· You have difficulty breathing or sharp pains in your chest. °· You develop any reaction or side effects to medicines given. °MAKE SURE YOU: °· Understand these instructions. °· Will watch your condition. °· Will get help right away if you are not doing well or get worse. °Document Released: 12/07/2005 Document Revised: 02/29/2012 Document Reviewed: 06/16/2011 °[employer]® Patient Information ©2015 [employer]. This information is not intended to replace advice given to you by your health care provider. Make sure you discuss any questions you have with your health care provider. ° °

## 2015-04-14 NOTE — ED Notes (Signed)
Pt woke up around 230 am with severe cramping and vomiting.

## 2015-04-14 NOTE — ED Notes (Signed)
[facility] having [facility]icit[facility], Dr.Brydan Downard notified.

## 2015-04-14 NOTE — ED Provider Notes (Signed)
CSN: [id number]     Arrival date & time 04/14/15  0631 History   First MD Initiated Contact with Patient 04/14/15 [phone]     Chief Complaint  Patient presents with  . Abdominal Pain     (Consider location/radiation/quality/duration/timing/severity/associated sxs/prior Treatment) HPI Comments:  The patient is a 44 year old male, he has a recent history of new onset seizures, he followed up in the outpatient setting with the neurologist and was started on Topamax, he has had 2 doses. He has recently been taking tramadol long term which was thought to lower the seizure threshold, he reports a positive EEG in the office. The patient has done well on Suboxone therapy coming off of tramadol and has been taking Topamax for the last 2 days however he noticed that over the last week he has had a decrease in the color of his stools may become very pale and gray, he also notes that they have been foul-smelling, he also complains of abdominal pain which started at 2:30 AM, was acute in onset, crampy, persistent, diffuse, not associated with diarrhea fevers or chills. The symptoms are persistent, currently his pain is 10 out of 10, nothing seems to make this better or worse, not associated with eating.  Patient is a 44 y.o. male presenting with abdominal pain. The history is provided by the patient.  Abdominal Pain   History reviewed. No pertinent past medical history. Past Surgical History  Procedure Laterality Date  . Ligament repair Left     left ankle  . Knuckle surgery Right   . Knee arthroscopy Left    History reviewed. No pertinent family history. History  Substance Use Topics  . Smoking status: Former Games developermoker  . Smokeless tobacco: Current User    Types: Chew  . Alcohol Use: No    Review of Systems  Gastrointestinal: Positive for abdominal pain.  All other systems reviewed and are negative.     Allergies  Review of patient's allergies indicates no known allergies.  Home Medications    Prior to Admission medications   Medication Sig Start Date End Date Taking? Authorizing Provider  ALPRAZolam Prudy Feeler(XANAX) 0.5 MG tablet Take 0.5 mg by mouth 3 (three) times daily as needed for anxiety.   Yes Historical Provider, MD  Aspirin-Acetaminophen-Caffeine (GOODY HEADACHE PO) Take 1 packet by mouth daily as needed (for pain).   Yes Historical Provider, MD  buprenorphine-naloxone (SUBOXONE) 8-2 MG SUBL SL tablet Place 1 tablet under the tongue daily.   Yes Historical Provider, MD  omeprazole (PRILOSEC) 20 MG capsule Take 20 mg by mouth daily.   Yes Historical Provider, MD  oxymetazoline (AFRIN) 0.05 % nasal spray Place 1 spray into both nostrils 2 (two) times daily.   Yes Historical Provider, MD  topiramate (TOPAMAX) 25 MG capsule Take 25 mg by mouth daily.   Yes Historical Provider, MD  amitriptyline (ELAVIL) 50 MG tablet Take 50 mg by mouth at bedtime.    Historical Provider, MD  cloNIDine (CATAPRES) 0.1 MG tablet Take 1 tablet (0.1 mg total) by mouth 3 (three) times daily as needed (tid prn for 2 days then bid for 2 days, then q day for 2 days). 04/07/15   Benjiman CoreNathan Pickering, MD  dicyclomine (BENTYL) 20 MG tablet Take 1 tablet (20 mg total) by mouth 2 (two) times daily as needed for spasms. 04/07/15   Benjiman CoreNathan Pickering, MD  promethazine (PHENERGAN) 25 MG tablet Take 1 tablet (25 mg total) by mouth every 6 (six) hours as needed for nausea. 04/07/15  Benjiman Core, MD   BP 136/93 mmHg  Pulse 93  Temp(Src) 98.7 F (37.1 C) (Oral)  Resp 20  Ht 6' (1.829 m)  Wt 270 lb (122.471 kg)  BMI 36.61 kg/m2  SpO2 98% Physical Exam  Constitutional: He appears well-developed and well-nourished. No distress.  HENT:  Head: Normocephalic and atraumatic.  Mouth/Throat: Oropharynx is clear and moist. No oropharyngeal exudate.  Eyes: Conjunctivae and EOM are normal. Pupils are equal, round, and reactive to light. Right eye exhibits no discharge. Left eye exhibits no discharge. No scleral icterus.  Neck:  Normal range of motion. Neck supple. No JVD present. No thyromegaly present.  Cardiovascular: Regular rhythm, normal heart sounds and intact distal pulses.  Exam reveals no gallop and no friction rub.   No murmur heard.  Mild tachycardia  Pulmonary/Chest: Effort normal and breath sounds normal. No respiratory distress. He has no wheezes. He has no rales.  Abdominal: Soft. Bowel sounds are normal. He exhibits no distension and no mass. There is no tenderness.  Musculoskeletal: Normal range of motion. He exhibits no edema or tenderness.  Lymphadenopathy:    He has no cervical adenopathy.  Neurological: He is alert. Coordination normal.  Skin: Skin is warm and dry. No rash noted. No erythema.  Psychiatric: He has a normal mood and affect. His behavior is normal.  Nursing note and vitals reviewed.   ED Course  Procedures (including critical care time) Labs Review Labs Reviewed  COMPREHENSIVE METABOLIC PANEL - Abnormal; Notable for the following:    Glucose, Bld 107 (*)    GFR calc non Af Amer 88 (*)    All other components within normal limits  CBC WITH DIFFERENTIAL/PLATELET - Abnormal; Notable for the following:    WBC 14.2 (*)    Neutrophils Relative % 89 (*)    Neutro Abs 12.7 (*)    Lymphocytes Relative 8 (*)    All other components within normal limits  URINALYSIS, ROUTINE W REFLEX MICROSCOPIC - Abnormal; Notable for the following:    pH 8.5 (*)    All other components within normal limits  I-STAT CG4 LACTIC ACID, ED - Abnormal; Notable for the following:    Lactic Acid, Venous 2.11 (*)    All other components within normal limits  LIPASE, BLOOD  I-STAT CG4 LACTIC ACID, ED    Imaging Review US Abdomen Complete  04/14/2015   CLINICAL DATA:  Mid epigastric abdominal pain since this morning. Ex-smoker.  EXAM: ULTRASOUND ABDOMEN COMPLETE  COMPARISON:  None.  FINDINGS: Gallbladder: Small polyp in the proximal gallbladder. Borderline gallbladder wall thickening with a maximum  thickness of 3.2 mm. No gallstones or pericholecystic fluid. The patient was not focally tender over the gallbladder.  Common bile duct: Diameter: 2.5 mm  Liver: Partially obscured by overlying bowel gas. The visualized portions appear normal.  IVC: Incompletely visualized.  No visible abnormality.  Pancreas: Visualized portion unremarkable.  Spleen: Size and appearance within normal limits.  Right Kidney: Length: 10.6 cm. Echogenicity within normal limits. No mass or hydronephrosis visualized.  Left Kidney: Length: 12.2 cm. Possible small calculi. Echogenicity within normal limits. No mass or hydronephrosis visualized.  Abdominal aorta: No aneurysm visualized.  Other findings: None.  IMPRESSION: 1. Limited examination due to overlying bowel gas. 2. Small gallbladder polyp. 3. Borderline gallbladder wall thickening without evidence of acute cholecystitis. 4. Possible small, nonobstructing left renal calculi.   Electronically Signed   By: Beckie Salts M.D.   On: 04/14/2015 11:05   Ct Abdomen Pelvis  W Contrast  04/14/2015   CLINICAL DATA:  Abdominal pain and vomiting since earlier today.  EXAM: CT ABDOMEN AND PELVIS WITH CONTRAST  TECHNIQUE: Multidetector CT imaging of the abdomen and pelvis was performed using the standard protocol following bolus administration of intravenous contrast.  CONTRAST:  25mL OMNIPAQUE IOHEXOL 300 MG/ML SOLN, OMNIPAQUE IOHEXOL 300 MG/ML SOLN  COMPARISON:  Abdomen ultrasound obtained earlier today.  FINDINGS: 6 mm mid left renal calculus. 5 mm lower pole left renal calculus. Normal appearing right kidney. No bladder or ureteral calculi and no hydronephrosis. Normal-sized prostate gland containing coarse calcifications.  Normal appearing liver, spleen, pancreas, gallbladder and adrenal glands. Small bilateral inguinal hernias containing fat. Enlarged appendix containing fluid and demonstrating diffuse wall thickening and mild periappendiceal soft tissue stranding. The appendix  measures 16 mm in maximum diameter on image number 59. No other gastrointestinal abnormalities are seen. No enlarged lymph nodes. Clear lung bases. Lumbar and lower thoracic spine degenerative changes.  IMPRESSION: 1. Acute appendicitis without abscess. 2. Small bilateral inguinal hernias containing fat. 3. Small, nonobstructing left renal calculi. These results will be called to the ordering clinician or representative by the Radiologist Assistant, and communication documented in the PACS or zVision Dashboard.   Electronically Signed   By: Beckie Salts M.D.   On: 04/14/2015 13:31      MDM   Final diagnoses:  Abdominal pain  Acute appendicitis, unspecified acute appendicitis type     strangely the patient has no abdominal tenderness including the right upper quadrant, his significant abdominal tenderness  Seems to be pain out of proportion to exam, given his tachycardia will obtain imaging, labs including lactic acid and give IV fluids.  Labs with elevated WBC and lacitc acid.  Will d/w Dr. Lovell Sheehan - [place] unrevealing, CT with acute appy -   NPO  Unasyn Pain meds  Meds given in ED:  Medications  Ampicillin-Sulbactam (UNASYN) 3 g in sodium chloride 0.9 % 100 mL IVPB (3 g Intravenous New Bag/Given 04/14/15 1347)  0.9 %  sodium chloride infusion ( Intravenous Stopped 04/14/15 1134)  ondansetron (ZOFRAN) injection 4 mg (4 mg Intravenous Given 04/14/15 0755)  HYDROmorphone (DILAUDID) injection 1 mg (1 mg Intravenous Given 04/14/15 0756)  sodium chloride 0.9 % bolus 1,000 mL (0 mLs Intravenous Stopped 04/14/15 1240)  fentaNYL (SUBLIMAZE) injection 100 mcg (100 mcg Intravenous Given 04/14/15 1133)  iohexol (OMNIPAQUE) 300 MG/ML solution 25 mL (25 mLs Oral Contrast Given 04/14/15 1218)  iohexol (OMNIPAQUE) 300 MG/ML solution 100 mL (100 mLs Intravenous Contrast Given 04/14/15 1218)  HYDROmorphone (DILAUDID) injection 1 mg (1 mg Intravenous Given 04/14/15 1400)      Eber Hong, MD 04/14/15  1404

## 2015-04-14 NOTE — ED Notes (Signed)
US at bedside

## 2015-04-14 NOTE — Op Note (Signed)
Patient:  Jimmy Sharp  DOB:  05/01/1971  MRN:  [id number]   Preop Diagnosis:  Acute appendicitis  Postop Diagnosis:  Same  Procedure:  Laparoscopic appendectomy  Surgeon:  Franky MachoMark Aidynn Polendo, M.D.  Anes:  Gen. endotracheal  Indications:  Patient is a 44 year old white male presents with a less than 24-hour history of worsening right lower quadrant abdominal pain. CT scan the abdomen revealed acute appendicitis. The patient now comes to the operating room for laparoscopic appendectomy. The risks and benefits of the procedure including bleeding, infection, and the possibility of an open procedure were fully explained to the patient, who gave informed consent.  Procedure note:  The patient was placed the supine position. After induction of general endotracheal anesthesia, the abdomen was prepped and draped using usual sterile technique with DuraPrep. Surgical site confirmation was performed.  A supraumbilical incision was made down to the fascia. A Veress needle was introduced into the abdominal cavity and confirmation of placement was done using the saline drop test. The abdomen was then insufflated to 16 mmHg pressure. An 11 mm trocar was introduced into the abdominal cavity under direct visualization without difficulty. The patient was placed in deeper Trendelenburg position and additional 12 mm trocar was placed the suprapubic region and a 5 mm trocar was placed left lower quadrant region. The appendix was visualized and noted to be acutely inflamed. No evidence of perforation was noted. The mesoappendix was divided using the harmonic scalpel. A standard Endo GIA was placed across the base the appendix and fired. The staple line was inspected and noted within normal limits. The appendix was removed using an Endo Catch bag without difficulty. All fluid and air were then evacuated from the abdominal cavity prior to removal of the trochars.  All wounds were irrigated with normal saline. All wounds  were injected with 0.5% Sensorcaine. The supraumbilical fascia was reapproximated using an 0 Vicryl interrupted suture. All skin incisions were closed using staples. Betadine ointment and dry sterile dressings were applied.  All tape and needle counts were correct at the end of the procedure. Patient was extubated in the operating room and transferred to PACU in stable condition.  Complications:  None  EBL:  Minimal  Specimen:  Appendix

## 2015-04-14 NOTE — Anesthesia Procedure Notes (Signed)
Procedure Name: Intubation Performed by: Moshe SalisburyANIEL, Jamielynn Wigley E Pre-anesthesia Checklist: Patient identified, Patient being monitored, Timeout performed, Emergency Drugs available and Suction available Patient Re-evaluated:Patient Re-evaluated prior to inductionOxygen Delivery Method: Circle System Utilized Preoxygenation: Pre-oxygenation with 100% oxygen Intubation Type: IV induction, Rapid sequence and Cricoid Pressure applied Ventilation: Mask ventilation without difficulty Laryngoscope Size: Mac and 3 Grade View: Grade I Tube type: Oral Tube size: 8.0 mm Number of attempts: 1 Airway Equipment and Method: Stylet Placement Confirmation: ETT inserted through vocal cords under direct vision,  positive ETCO2 and breath sounds checked- equal and bilateral Secured at: 21 cm Tube secured with: Tape Dental Injury: Teeth and Oropharynx as per pre-operative assessment

## 2015-04-14 NOTE — H&P (Signed)
Jimmy Sharp is an 44 y.o. male.   Chief Complaint: Right lower quadrant abdominal pain HPI: Patient is a 44 year old white male who earlier today started experiencing lower abdominal pain. Became crampy in nature. CT scan of the abdomen was performed which revealed acute appendicitis.  History reviewed. No pertinent past medical history.  Past Surgical History  Procedure Laterality Date  . Ligament repair Left     left ankle  . Knuckle surgery Right   . Knee arthroscopy Left     History reviewed. No pertinent family history. Social History:  reports that he has quit smoking. His smokeless tobacco use includes Chew. He reports that he does not drink alcohol or use illicit drugs.  Allergies: No Known Allergies   (Not in a hospital admission)  Results for orders placed or performed during the hospital encounter of 04/14/15 (from the past 48 hour(s))  Lipase, blood     Status: None   Collection Time: 04/14/15  8:14 AM  Result Value Ref Range   Lipase 31 11 - 59 U/L  Comprehensive metabolic panel     Status: Abnormal   Collection Time: 04/14/15  8:14 AM  Result Value Ref Range   Sodium 141 135 - 145 mmol/L   Potassium 3.6 3.5 - 5.1 mmol/L   Chloride 106 96 - 112 mmol/L   CO2 26 19 - 32 mmol/L   Glucose, Bld 107 (H) 70 - 99 mg/dL   BUN 12 6 - 23 mg/dL   Creatinine, Ser 1.02 0.50 - 1.35 mg/dL   Calcium 9.6 8.4 - 10.5 mg/dL   Total Protein 7.9 6.0 - 8.3 g/dL   Albumin 4.6 3.5 - 5.2 g/dL   AST 28 0 - 37 U/L   ALT 31 0 - 53 U/L   Alkaline Phosphatase 69 39 - 117 U/L   Total Bilirubin 0.5 0.3 - 1.2 mg/dL   GFR calc non Af Amer 88 (L) >90 mL/min   GFR calc Af Amer >90 >90 mL/min    Comment: (NOTE) The eGFR has been calculated using the CKD EPI equation. This calculation has not been validated in all clinical situations. eGFR's persistently <90 mL/min signify possible Chronic Kidney Disease.    Anion gap 9 5 - 15  CBC with Differential     Status: Abnormal   Collection  Time: 04/14/15  8:14 AM  Result Value Ref Range   WBC 14.2 (H) 4.0 - 10.5 K/uL   RBC 5.25 4.22 - 5.81 MIL/uL   Hemoglobin 16.6 13.0 - 17.0 g/dL   HCT 48.1 39.0 - 52.0 %   MCV 91.6 78.0 - 100.0 fL   MCH 31.6 26.0 - 34.0 pg   MCHC 34.5 30.0 - 36.0 g/dL   RDW 12.5 11.5 - 15.5 %   Platelets 350 150 - 400 K/uL   Neutrophils Relative % 89 (H) 43 - 77 %   Neutro Abs 12.7 (H) 1.7 - 7.7 K/uL   Lymphocytes Relative 8 (L) 12 - 46 %   Lymphs Abs 1.1 0.7 - 4.0 K/uL   Monocytes Relative 3 3 - 12 %   Monocytes Absolute 0.4 0.1 - 1.0 K/uL   Eosinophils Relative 0 0 - 5 %   Eosinophils Absolute 0.0 0.0 - 0.7 K/uL   Basophils Relative 0 0 - 1 %   Basophils Absolute 0.0 0.0 - 0.1 K/uL  Urinalysis, Routine w reflex microscopic     Status: Abnormal   Collection Time: 04/14/15  8:15 AM  Result Value  Ref Range   Color, Urine YELLOW YELLOW   APPearance CLEAR CLEAR   Specific Gravity, Urine 1.015 1.005 - 1.030   pH 8.5 (H) 5.0 - 8.0   Glucose, UA NEGATIVE NEGATIVE mg/dL   Hgb urine dipstick NEGATIVE NEGATIVE   Bilirubin Urine NEGATIVE NEGATIVE   Ketones, ur NEGATIVE NEGATIVE mg/dL   Protein, ur NEGATIVE NEGATIVE mg/dL   Urobilinogen, UA 0.2 0.0 - 1.0 mg/dL   Nitrite NEGATIVE NEGATIVE   Leukocytes, UA NEGATIVE NEGATIVE    Comment: MICROSCOPIC NOT DONE ON URINES WITH NEGATIVE PROTEIN, BLOOD, LEUKOCYTES, NITRITE, OR GLUCOSE <1000 mg/dL.  I-Stat CG4 Lactic Acid, ED     Status: Abnormal   Collection Time: 04/14/15  9:13 AM  Result Value Ref Range   Lactic Acid, Venous 2.11 (HH) 0.5 - 2.0 mmol/L   US Abdomen Complete  04/14/2015   CLINICAL DATA:  Mid epigastric abdominal pain since this morning. Ex-smoker.  EXAM: ULTRASOUND ABDOMEN COMPLETE  COMPARISON:  None.  FINDINGS: Gallbladder: Small polyp in the proximal gallbladder. Borderline gallbladder wall thickening with a maximum thickness of 3.2 mm. No gallstones or pericholecystic fluid. The patient was not focally tender over the gallbladder.  Common  bile duct: Diameter: 2.5 mm  Liver: Partially obscured by overlying bowel gas. The visualized portions appear normal.  IVC: Incompletely visualized.  No visible abnormality.  Pancreas: Visualized portion unremarkable.  Spleen: Size and appearance within normal limits.  Right Kidney: Length: 10.6 cm. Echogenicity within normal limits. No mass or hydronephrosis visualized.  Left Kidney: Length: 12.2 cm. Possible small calculi. Echogenicity within normal limits. No mass or hydronephrosis visualized.  Abdominal aorta: No aneurysm visualized.  Other findings: None.  IMPRESSION: 1. Limited examination due to overlying bowel gas. 2. Small gallbladder polyp. 3. Borderline gallbladder wall thickening without evidence of acute cholecystitis. 4. Possible small, nonobstructing left renal calculi.   Electronically Signed   By: Claudie Revering M.D.   On: 04/14/2015 11:05   Ct Abdomen Pelvis W Contrast  04/14/2015   CLINICAL DATA:  Abdominal pain and vomiting since earlier today.  EXAM: CT ABDOMEN AND PELVIS WITH CONTRAST  TECHNIQUE: Multidetector CT imaging of the abdomen and pelvis was performed using the standard protocol following bolus administration of intravenous contrast.  CONTRAST:  15m OMNIPAQUE IOHEXOL 300 MG/ML SOLN, 1055mOMNIPAQUE IOHEXOL 300 MG/ML SOLN  COMPARISON:  Abdomen ultrasound obtained earlier today.  FINDINGS: 6 mm mid left renal calculus. 5 mm lower pole left renal calculus. Normal appearing right kidney. No bladder or ureteral calculi and no hydronephrosis. Normal-sized prostate gland containing coarse calcifications.  Normal appearing liver, spleen, pancreas, gallbladder and adrenal glands. Small bilateral inguinal hernias containing fat. Enlarged appendix containing fluid and demonstrating diffuse wall thickening and mild periappendiceal soft tissue stranding. The appendix measures 16 mm in maximum diameter on image number 59. No other gastrointestinal abnormalities are seen. No enlarged lymph nodes.  Clear lung bases. Lumbar and lower thoracic spine degenerative changes.  IMPRESSION: 1. Acute appendicitis without abscess. 2. Small bilateral inguinal hernias containing fat. 3. Small, nonobstructing left renal calculi. These results will be called to the ordering clinician or representative by the Radiologist Assistant, and communication documented in the PACS or zVision Dashboard.   Electronically Signed   By: StClaudie Revering.D.   On: 04/14/2015 13:31    Review of Systems  Constitutional: Positive for malaise/fatigue.  HENT: Negative.   Eyes: Negative.   Respiratory: Negative.   Cardiovascular: Negative.   Gastrointestinal: Positive for abdominal pain.  Genitourinary:  Negative.   Musculoskeletal: Negative.   Skin: Negative.   Psychiatric/Behavioral: The patient is nervous/anxious.     Blood pressure 136/93, pulse 93, temperature 98.7 F (37.1 C), temperature source Oral, resp. rate 20, height 6' (1.829 m), weight 122.471 kg (270 lb), SpO2 98 %. Physical Exam  Vitals reviewed. Constitutional: He is oriented to person, place, and time. He appears well-developed and well-nourished.  HENT:  Head: Normocephalic and atraumatic.  Neck: Normal range of motion. Neck supple.  Cardiovascular: Normal rate, regular rhythm and normal heart sounds.   Respiratory: Effort normal and breath sounds normal.  GI: Soft. He exhibits no distension. There is tenderness. There is no rebound.  Tender in the right lower quadrant to palpation. No rigidity noted.  Neurological: He is alert and oriented to person, place, and time.  Skin: Skin is warm and dry.     Assessment/Plan Impression: Acute appendicitis Plan: Patient be taken to the operating room for laparoscopic appendectomy. The risks and benefits of the procedure including bleeding, infection, and the possibility of an open procedure were fully explained to the patient, who gave informed consent.  Tambra Muller A 04/14/2015, 2:45 PM

## 2015-04-14 NOTE — Transfer of Care (Signed)
Immediate Anesthesia Transfer of Care Note  Patient: Jimmy Sharp  Procedure(s) Performed: Procedure(s): APPENDECTOMY LAPAROSCOPIC (N/A)  Patient Location: PACU  Anesthesia Type:General  Level of Consciousness: awake and oriented  Airway & Oxygen Therapy: Patient Spontanous Breathing and Patient connected to face mask oxygen  Post-op Assessment: Report given to RN and Post -op Vital signs reviewed and stable  Post vital signs: Reviewed and stable  Last Vitals:  Filed Vitals:   04/14/15 1618  BP:   Pulse:   Temp: 36.9 C  Resp:     Complications: No apparent anesthesia complications

## 2015-04-15 ENCOUNTER — Ambulatory Visit (HOSPITAL_COMMUNITY): Payer: 59

## 2015-04-15 NOTE — Discharge Summary (Signed)
Physician Discharge Summary  Patient ID: Jimmy Sharp MRN: [id number] DOB/AGE: 44/01/1971 44 y.o.  Admit date: 04/14/2015 Discharge date: 04/14/2015 Admission Diagnoses:acute appendicitis  Discharge Diagnoses: acute appendicitis Active Problems:   Acute appendicitis   Discharged Condition: good  Hospital Course: patient is a 44 year old white male who presented emergency room with worsening right lower quadrant abdominal pain. He was found on CT scan the abdomen to have acute appendicitis. He underwent laparoscopic appendectomy on 04/14/2015. He tolerated the procedure well. His postoperative course has been unremarkable. He was discharged home later the same day good and improving condition.  Treatments: surgery: laparoscopic appendectomy on 04/14/2015  Discharge Exam: Blood pressure 116/69, pulse 96, temperature 98.4 F (36.9 C), temperature source Oral, resp. rate 24, height 6' (1.829 m), weight 122.471 kg (270 lb), SpO2 95 %. General appearance: alert, cooperative and no distress Resp: clear to auscultation bilaterally Cardio: regular rate and rhythm, S1, S2 normal, no murmur, click, rub or gallop GI: soft. Dressings dry and intact.  Disposition: 01-Home or Self Care     Medication List    TAKE these medications        ALPRAZolam 0.5 MG tablet  Commonly known as:  XANAX  Take 0.5 mg by mouth 3 (three) times daily as needed for anxiety.     amitriptyline 50 MG tablet  Commonly known as:  ELAVIL  Take 50 mg by mouth at bedtime.     buprenorphine-naloxone 8-2 MG Subl SL tablet  Commonly known as:  SUBOXONE  Place 1 tablet under the tongue daily.     cloNIDine 0.1 MG tablet  Commonly known as:  CATAPRES  Take 1 tablet (0.1 mg total) by mouth 3 (three) times daily as needed (tid prn for 2 days then bid for 2 days, then q day for 2 days).     dicyclomine 20 MG tablet  Commonly known as:  BENTYL  Take 1 tablet (20 mg total) by mouth 2 (two) times daily as needed  for spasms.     omeprazole 20 MG capsule  Commonly known as:  PRILOSEC  Take 20 mg by mouth daily.     oxyCODONE-acetaminophen 7.5-325 MG per tablet  Commonly known as:  PERCOCET  Take 1-2 tablets by mouth every 4 (four) hours as needed.     oxymetazoline 0.05 % nasal spray  Commonly known as:  AFRIN  Place 1 spray into both nostrils 2 (two) times daily.     promethazine 25 MG tablet  Commonly known as:  PHENERGAN  Take 1 tablet (25 mg total) by mouth every 6 (six) hours as needed for nausea.     topiramate 25 MG capsule  Commonly known as:  TOPAMAX  Take 25 mg by mouth daily.           Follow-up Information    Follow up with Dalia HeadingJENKINS,Alvester Eads A, MD. Schedule an appointment as soon as possible for a visit on 04/23/2015.   Specialty:  General Surgery   Contact information:   [address] [place] [id number] [phone]       Signed: Franky MachoJENKINS,Luverna Degenhart A 04/15/2015, 9:20 AM

## 2015-04-15 NOTE — Anesthesia Postprocedure Evaluation (Signed)
  Anesthesia Post-op Note  Patient: Jimmy Sharp  Procedure(s) Performed: Procedure(s): APPENDECTOMY LAPAROSCOPIC (N/A)  Patient Location: PACU  Anesthesia Type:General  Level of Consciousness: awake, alert  and oriented  Airway and Oxygen Therapy: Patient Spontanous Breathing  Post-op Pain: moderate  Post-op Assessment: Post-op Vital signs reviewed, Patient's Cardiovascular Status Stable, Respiratory Function Stable, Patent Airway and No signs of Nausea or vomiting  Post-op Vital Signs: Reviewed and stable  Last Vitals:  Filed Vitals:   04/14/15 1820  BP: 116/69  Pulse:   Temp: 36.9 C  Resp: 24    Complications: No apparent anesthesia complications, late entry.

## 2015-04-16 ENCOUNTER — Encounter (HOSPITAL_COMMUNITY): Payer: Self-pay | Admitting: General Surgery

## 2015-04-26 ENCOUNTER — Ambulatory Visit (HOSPITAL_COMMUNITY)
Admission: RE | Admit: 2015-04-26 | Discharge: 2015-04-26 | Disposition: A | Discharge status: Home / Self Care | Payer: 59 | Source: Ambulatory Visit | Attending: Neurology | Admitting: Neurology

## 2015-04-26 DIAGNOSIS — R55 Syncope and collapse: Secondary | ICD-10-CM | POA: Insufficient documentation

## 2015-04-26 DIAGNOSIS — R569 Unspecified convulsions: Secondary | ICD-10-CM | POA: Diagnosis not present

## 2015-04-26 MED ORDER — GADOBENATE DIMEGLUMINE 529 MG/ML IV SOLN
20.0000 mL | Freq: Once | INTRAVENOUS | Status: AC | PRN
  Administered 2015-04-26: 20 mL via INTRAVENOUS

## 2015-05-27 ENCOUNTER — Encounter (HOSPITAL_BASED_OUTPATIENT_CLINIC_OR_DEPARTMENT_OTHER): Payer: Self-pay | Admitting: *Deleted

## 2015-05-29 ENCOUNTER — Other Ambulatory Visit: Payer: Self-pay | Admitting: Orthopedic Surgery

## 2015-05-30 ENCOUNTER — Ambulatory Visit (HOSPITAL_BASED_OUTPATIENT_CLINIC_OR_DEPARTMENT_OTHER): Payer: Worker's Compensation | Admitting: Certified Registered"

## 2015-05-30 ENCOUNTER — Ambulatory Visit (HOSPITAL_BASED_OUTPATIENT_CLINIC_OR_DEPARTMENT_OTHER)
Admission: RE | Admit: 2015-05-30 | Discharge: 2015-05-30 | Disposition: A | Discharge status: Home / Self Care | Payer: Worker's Compensation | Source: Ambulatory Visit | Attending: Orthopedic Surgery | Admitting: Orthopedic Surgery

## 2015-05-30 ENCOUNTER — Encounter (HOSPITAL_BASED_OUTPATIENT_CLINIC_OR_DEPARTMENT_OTHER): Payer: Self-pay | Admitting: Certified Registered"

## 2015-05-30 ENCOUNTER — Encounter (HOSPITAL_BASED_OUTPATIENT_CLINIC_OR_DEPARTMENT_OTHER)
Admission: RE | Disposition: A | Discharge status: Home / Self Care | Payer: Self-pay | Source: Ambulatory Visit | Attending: Orthopedic Surgery

## 2015-05-30 DIAGNOSIS — S93492D Sprain of other ligament of left ankle, subsequent encounter: Secondary | ICD-10-CM | POA: Diagnosis not present

## 2015-05-30 DIAGNOSIS — M65872 Other synovitis and tenosynovitis, left ankle and foot: Secondary | ICD-10-CM | POA: Diagnosis present

## 2015-05-30 DIAGNOSIS — M1990 Unspecified osteoarthritis, unspecified site: Secondary | ICD-10-CM | POA: Insufficient documentation

## 2015-05-30 DIAGNOSIS — F17210 Nicotine dependence, cigarettes, uncomplicated: Secondary | ICD-10-CM | POA: Diagnosis not present

## 2015-05-30 HISTORY — DX: Anxiety disorder, unspecified: F41.9

## 2015-05-30 HISTORY — PX: ANKLE ARTHROSCOPY WITH DRILLING/MICROFRACTURE: SHX5580

## 2015-05-30 HISTORY — DX: Major depressive disorder, single episode, unspecified: F32.9

## 2015-05-30 HISTORY — DX: Unspecified convulsions: R56.9

## 2015-05-30 HISTORY — DX: Unspecified synovitis and tenosynovitis, left ankle and foot: M65.972

## 2015-05-30 HISTORY — PX: TENOLYSIS: SHX396

## 2015-05-30 HISTORY — DX: Synovitis and tenosynovitis, unspecified: M65.9

## 2015-05-30 HISTORY — DX: Gastro-esophageal reflux disease without esophagitis: K21.9

## 2015-05-30 HISTORY — DX: Depression, unspecified: F32.A

## 2015-05-30 LAB — POCT HEMOGLOBIN-HEMACUE: Hemoglobin: 16.2 g/dL (ref 13.0–17.0)

## 2015-05-30 SURGERY — ARTHROSCOPY, ANKLE, WITH MICROFRACTURE
Anesthesia: Regional | Site: Ankle | Laterality: Left

## 2015-05-30 MED ORDER — OXYCODONE HCL 5 MG PO TABS
ORAL_TABLET | ORAL | Status: AC
  Filled 2015-05-30: qty 1

## 2015-05-30 MED ORDER — SODIUM CHLORIDE 0.9 % IR SOLN
Status: DC | PRN
  Administered 2015-05-30: 2000 mL

## 2015-05-30 MED ORDER — DEXAMETHASONE SODIUM PHOSPHATE 10 MG/ML IJ SOLN
INTRAMUSCULAR | Status: DC | PRN
  Administered 2015-05-30: 10 mg via INTRAVENOUS

## 2015-05-30 MED ORDER — FENTANYL CITRATE (PF) 100 MCG/2ML IJ SOLN
INTRAMUSCULAR | Status: AC
  Filled 2015-05-30: qty 6

## 2015-05-30 MED ORDER — MIDAZOLAM HCL 2 MG/2ML IJ SOLN
INTRAMUSCULAR | Status: AC
  Filled 2015-05-30: qty 2

## 2015-05-30 MED ORDER — HYDROMORPHONE HCL 1 MG/ML IJ SOLN
0.2500 mg | INTRAMUSCULAR | Status: DC | PRN
  Administered 2015-05-30 (×2): 0.5 mg via INTRAVENOUS
  Administered 2015-05-30 (×2): 0.25 mg via INTRAVENOUS
  Administered 2015-05-30: 0.5 mg via INTRAVENOUS

## 2015-05-30 MED ORDER — DOCUSATE SODIUM 100 MG PO CAPS: 100.0000 mg | ORAL_CAPSULE | Freq: Two times a day (BID) | ORAL | Status: DC

## 2015-05-30 MED ORDER — GLYCOPYRROLATE 0.2 MG/ML IJ SOLN: 0.2000 mg | Freq: Once | INTRAMUSCULAR | Status: DC | PRN

## 2015-05-30 MED ORDER — MEPERIDINE HCL 25 MG/ML IJ SOLN: 6.2500 mg | INTRAMUSCULAR | Status: DC | PRN

## 2015-05-30 MED ORDER — SENNA 8.6 MG PO TABS
2.0000 | ORAL_TABLET | Freq: Two times a day (BID) | ORAL | Status: DC
Start: 2015-05-30 — End: 2015-11-26

## 2015-05-30 MED ORDER — OXYCODONE HCL 5 MG/5ML PO SOLN: 5.0000 mg | Freq: Once | ORAL | Status: AC | PRN

## 2015-05-30 MED ORDER — OXYCODONE HCL 5 MG PO TABS: 5.0000 mg | ORAL_TABLET | ORAL | Status: DC | PRN

## 2015-05-30 MED ORDER — CHLORHEXIDINE GLUCONATE 4 % EX LIQD: 60.0000 mL | Freq: Once | CUTANEOUS | Status: DC

## 2015-05-30 MED ORDER — CEFAZOLIN SODIUM 1-5 GM-% IV SOLN
INTRAVENOUS | Status: AC
  Filled 2015-05-30: qty 50

## 2015-05-30 MED ORDER — FENTANYL CITRATE (PF) 100 MCG/2ML IJ SOLN
INTRAMUSCULAR | Status: AC
  Filled 2015-05-30: qty 2

## 2015-05-30 MED ORDER — CEFAZOLIN SODIUM-DEXTROSE 2-3 GM-% IV SOLR
INTRAVENOUS | Status: AC
  Filled 2015-05-30: qty 50

## 2015-05-30 MED ORDER — OXYCODONE HCL 5 MG PO TABS
5.0000 mg | ORAL_TABLET | Freq: Once | ORAL | Status: AC | PRN
  Administered 2015-05-30: 5 mg via ORAL

## 2015-05-30 MED ORDER — SODIUM CHLORIDE 0.9 % IV SOLN: INTRAVENOUS | Status: DC

## 2015-05-30 MED ORDER — LACTATED RINGERS IV SOLN
INTRAVENOUS | Status: DC
  Administered 2015-05-30 (×2): via INTRAVENOUS

## 2015-05-30 MED ORDER — ASPIRIN EC 325 MG PO TBEC: 325.0000 mg | DELAYED_RELEASE_TABLET | Freq: Every day | ORAL | Status: DC

## 2015-05-30 MED ORDER — ONDANSETRON HCL 4 MG/2ML IJ SOLN
INTRAMUSCULAR | Status: DC | PRN
  Administered 2015-05-30: 4 mg via INTRAVENOUS

## 2015-05-30 MED ORDER — CEFAZOLIN SODIUM-DEXTROSE 2-3 GM-% IV SOLR
2.0000 g | INTRAVENOUS | Status: AC
  Administered 2015-05-30: 3 g via INTRAVENOUS

## 2015-05-30 MED ORDER — HYDROMORPHONE HCL 1 MG/ML IJ SOLN
INTRAMUSCULAR | Status: AC
  Filled 2015-05-30: qty 1

## 2015-05-30 MED ORDER — PROPOFOL 10 MG/ML IV BOLUS
INTRAVENOUS | Status: DC | PRN
  Administered 2015-05-30: 400 mg via INTRAVENOUS

## 2015-05-30 MED ORDER — LIDOCAINE HCL (CARDIAC) 20 MG/ML IV SOLN
INTRAVENOUS | Status: DC | PRN
  Administered 2015-05-30: 30 mg via INTRAVENOUS

## 2015-05-30 MED ORDER — BUPIVACAINE-EPINEPHRINE (PF) 0.5% -1:200000 IJ SOLN
INTRAMUSCULAR | Status: DC | PRN
  Administered 2015-05-30: 25 mL via PERINEURAL

## 2015-05-30 MED ORDER — MIDAZOLAM HCL 2 MG/2ML IJ SOLN
1.0000 mg | INTRAMUSCULAR | Status: DC | PRN
  Administered 2015-05-30 (×3): 2 mg via INTRAVENOUS

## 2015-05-30 MED ORDER — FENTANYL CITRATE (PF) 100 MCG/2ML IJ SOLN
50.0000 ug | INTRAMUSCULAR | Status: AC | PRN
  Administered 2015-05-30: 25 ug via INTRAVENOUS
  Administered 2015-05-30 (×2): 100 ug via INTRAVENOUS
  Administered 2015-05-30: 50 ug via INTRAVENOUS
  Administered 2015-05-30: 25 ug via INTRAVENOUS

## 2015-05-30 SURGICAL SUPPLY — 82 items
BANDAGE ESMARK 6X9 LF (GAUZE/BANDAGES/DRESSINGS) ×2 IMPLANT
BLADE CUDA 2.0 (BLADE) IMPLANT
BLADE CUDA GRT WHITE 3.5 (BLADE) IMPLANT
BLADE CUDA SHAVER 3.5 (BLADE) ×3 IMPLANT
BLADE CUTTER GATOR 3.5 (BLADE) IMPLANT
BLADE SURG 15 STRL LF DISP TIS (BLADE) ×3 IMPLANT
BLADE SURG 15 STRL SS (BLADE) ×8
BNDG CMPR 9X6 STRL LF SNTH (GAUZE/BANDAGES/DRESSINGS) ×2
BNDG COHESIVE 4X5 TAN STRL (GAUZE/BANDAGES/DRESSINGS) ×4 IMPLANT
BNDG COHESIVE 6X5 TAN STRL LF (GAUZE/BANDAGES/DRESSINGS) ×3 IMPLANT
BNDG ESMARK 6X9 LF (GAUZE/BANDAGES/DRESSINGS) ×4
BOOT STEPPER DURA LG (SOFTGOODS) IMPLANT
BOOT STEPPER DURA MED (SOFTGOODS) IMPLANT
BOOT STEPPER DURA SM (SOFTGOODS) IMPLANT
BOOT STEPPER DURA XLG (SOFTGOODS) ×3 IMPLANT
BUR 3.5 LG SPHERICAL (BURR) IMPLANT
BUR CUDA 2.9 (BURR) ×3 IMPLANT
BUR CUDA 2.9MM (BURR) ×1
BUR FULL RADIUS 2.9 (BURR) IMPLANT
BUR FULL RADIUS 2.9MM (BURR)
BUR GATOR 2.9 (BURR) IMPLANT
BUR GATOR 2.9MM (BURR)
BUR OVAL 4.0 (BURR) IMPLANT
BUR SPHERICAL 2.9 (BURR) IMPLANT
BUR SPHERICAL 2.9MM (BURR)
BUR VERTEX HOODED 4.5 (BURR) IMPLANT
BURR 3.5 LG SPHERICAL (BURR)
BURR 3.5MM LG SPHERICAL (BURR)
CHLORAPREP W/TINT 26ML (MISCELLANEOUS) ×4 IMPLANT
CUFF TOURNIQUET SINGLE 34IN LL (TOURNIQUET CUFF) ×3 IMPLANT
DRAPE EXTREMITY T 121X128X90 (DRAPE) ×4 IMPLANT
DRAPE OEC MINIVIEW 54X84 (DRAPES) IMPLANT
DRAPE U-SHAPE 47X51 STRL (DRAPES) ×4 IMPLANT
DRSG EMULSION OIL 3X3 NADH (GAUZE/BANDAGES/DRESSINGS) ×1 IMPLANT
DRSG MEPITEL 4X7.2 (GAUZE/BANDAGES/DRESSINGS) ×3 IMPLANT
ELECT REM PT RETURN 9FT ADLT (ELECTROSURGICAL) ×4
ELECTRODE REM PT RTRN 9FT ADLT (ELECTROSURGICAL) ×2 IMPLANT
GAUZE SPONGE 4X4 12PLY STRL (GAUZE/BANDAGES/DRESSINGS) ×4 IMPLANT
GLOVE BIO SURGEON STRL SZ8 (GLOVE) ×4 IMPLANT
GLOVE BIOGEL PI IND STRL 6.5 (GLOVE) ×1 IMPLANT
GLOVE BIOGEL PI IND STRL 7.0 (GLOVE) ×1 IMPLANT
GLOVE BIOGEL PI IND STRL 8 (GLOVE) ×4 IMPLANT
GLOVE BIOGEL PI INDICATOR 6.5 (GLOVE) ×2
GLOVE BIOGEL PI INDICATOR 7.0 (GLOVE) ×2
GLOVE BIOGEL PI INDICATOR 8 (GLOVE) ×4
GLOVE ECLIPSE 6.5 STRL STRAW (GLOVE) ×9 IMPLANT
GLOVE ECLIPSE 7.5 STRL STRAW (GLOVE) ×4 IMPLANT
GLOVE EXAM NITRILE MD LF STRL (GLOVE) ×3 IMPLANT
GOWN STRL REUS W/ TWL LRG LVL3 (GOWN DISPOSABLE) ×3 IMPLANT
GOWN STRL REUS W/ TWL XL LVL3 (GOWN DISPOSABLE) ×4 IMPLANT
GOWN STRL REUS W/TWL LRG LVL3 (GOWN DISPOSABLE) ×8
GOWN STRL REUS W/TWL XL LVL3 (GOWN DISPOSABLE) ×8
MANIFOLD NEPTUNE II (INSTRUMENTS) ×4 IMPLANT
NS IRRIG 1000ML POUR BTL (IV SOLUTION) IMPLANT
PACK ARTHROSCOPY DSU (CUSTOM PROCEDURE TRAY) ×4 IMPLANT
PACK BASIN DAY SURGERY FS (CUSTOM PROCEDURE TRAY) ×4 IMPLANT
PAD CAST 4YDX4 CTTN HI CHSV (CAST SUPPLIES) ×2 IMPLANT
PADDING CAST ABS 4INX4YD NS (CAST SUPPLIES)
PADDING CAST ABS COTTON 4X4 ST (CAST SUPPLIES) IMPLANT
PADDING CAST COTTON 4X4 STRL (CAST SUPPLIES) ×4
PADDING CAST COTTON 6X4 STRL (CAST SUPPLIES) ×4 IMPLANT
PENCIL BUTTON HOLSTER BLD 10FT (ELECTRODE) IMPLANT
SANITIZER HAND PURELL 535ML FO (MISCELLANEOUS) ×4 IMPLANT
SET IRRIG Y TYPE TUR BLADDER L (SET/KITS/TRAYS/PACK) ×4 IMPLANT
SLEEVE SCD COMPRESS KNEE MED (MISCELLANEOUS) ×4 IMPLANT
SPLINT FAST PLASTER 5X30 (CAST SUPPLIES)
SPLINT PLASTER CAST FAST 5X30 (CAST SUPPLIES) IMPLANT
SPONGE LAP 18X18 X RAY DECT (DISPOSABLE) ×3 IMPLANT
STOCKINETTE 6  STRL (DRAPES) ×2
STOCKINETTE 6 STRL (DRAPES) ×2 IMPLANT
STRAP ANKLE FOOT DISTRACTOR (ORTHOPEDIC SUPPLIES) ×3 IMPLANT
SUCTION FRAZIER TIP 10 FR DISP (SUCTIONS) IMPLANT
SUT ETHILON 3 0 PS 1 (SUTURE) ×4 IMPLANT
SUT MNCRL AB 3-0 PS2 18 (SUTURE) ×3 IMPLANT
SUT VIC AB 0 SH 27 (SUTURE) ×3 IMPLANT
SUT VIC AB 2-0 SH 18 (SUTURE) IMPLANT
SYR BULB 3OZ (MISCELLANEOUS) ×3 IMPLANT
TOWEL OR 17X24 6PK STRL BLUE (TOWEL DISPOSABLE) ×7 IMPLANT
TUBE CONNECTING 20'X1/4 (TUBING)
TUBE CONNECTING 20X1/4 (TUBING) IMPLANT
WAND STAR VAC 90 (SURGICAL WAND) IMPLANT
WATER STERILE IRR 1000ML POUR (IV SOLUTION) ×4 IMPLANT

## 2015-05-30 NOTE — Discharge Instructions (Addendum)
Toni Arthurs, MD [facility]  Please read the following information regarding your care after surgery.  Medications  You only need a prescription for the narcotic pain medicine (ex. oxycodone, Percocet, Norco).  All of the other medicines listed below are available over the counter. X acetominophen (Tylenol) 650 mg every 4-6 hours as you need for minor pain X oxycodone as prescribed for moderate to severe pain ?   Narcotic pain medicine (ex. oxycodone, Percocet, Vicodin) will cause constipation.  To prevent this problem, take the following medicines while you are taking any pain medicine. X docusate sodium (Colace) 100 mg twice a day X senna (Senokot) 2 tablets twice a day  X To help prevent blood clots, take an aspirin (325 mg) once a day for a month after surgery.  You should also get up every hour while you are awake to move around.    Weight Bearing X Do not bear any weight on the operated leg or foot.  Cast / Splint / Dressing X Keep your splint or cast clean and dry.  Dont put anything (coat hanger, pencil, etc) down inside of it.  If it gets damp, use a hair dryer on the cool setting to dry it.  If it gets soaked, call the office to schedule an appointment for a cast change.    After your dressing, cast or splint is removed; you may shower, but do not soak or scrub the wound.  Allow the water to run over it, and then gently pat it dry.  Remove your boot 2 times per day and work on range of motion exercises demonstrated.  Do not remove the dressing, we will remove it at follow visit.  Swelling It is normal for you to have swelling where you had surgery.  To reduce swelling and pain, keep your toes above your nose for at least 3 days after surgery.  It may be necessary to keep your foot or leg elevated for several weeks.  If it hurts, it should be elevated.  Follow Up Call my office at [phone] when you are discharged from the hospital or surgery center to schedule  an appointment to be seen two weeks after surgery.  Call my office at [phone] if you develop a fever >101.5 F, nausea, vomiting, bleeding from the surgical site or severe pain.     Post Anesthesia Home Care Instructions  Activity: Get plenty of rest for the remainder of the day. A responsible adult should stay with you for 24 hours following the procedure.  For the next 24 hours, DO NOT: -Drive a car -Advertising copywriter -Drink alcoholic beverages -Take any medication unless instructed by your physician -Make any legal decisions or sign important papers.  Meals: Start with liquid foods such as gelatin or soup. Progress to regular foods as tolerated. Avoid greasy, spicy, heavy foods. If nausea and/or vomiting occur, drink only clear liquids until the nausea and/or vomiting subsides. Call your physician if vomiting continues.  Special Instructions/Symptoms: Your throat may feel dry or sore from the anesthesia or the breathing tube placed in your throat during surgery. If this causes discomfort, gargle with warm salt water. The discomfort should disappear within 24 hours.  If you had a scopolamine patch placed behind your ear for the management of post- operative nausea and/or vomiting:  1. The medication in the patch is effective for 72 hours, after which it should be removed.  Wrap patch in a tissue and discard in the trash. Wash hands thoroughly  with soap and water. 2. You may remove the patch earlier than 72 hours if you experience unpleasant side effects which may include dry mouth, dizziness or visual disturbances. 3. Avoid touching the patch. Wash your hands with soap and water after contact with the patch.    Regional Anesthesia Blocks  1. Numbness or the inability to move the "blocked" extremity may last from 3-48 hours after placement. The length of time depends on the medication injected and your individual response to the medication. If the numbness is not going away after  48 hours, call your surgeon.  2. The extremity that is blocked will need to be protected until the numbness is gone and the  Strength has returned. Because you cannot feel it, you will need to take extra care to avoid injury. Because it may be weak, you may have difficulty moving it or using it. You may not know what position it is in without looking at it while the block is in effect.  3. For blocks in the legs and feet, returning to weight bearing and walking needs to be done carefully. You will need to wait until the numbness is entirely gone and the strength has returned. You should be able to move your leg and foot normally before you try and bear weight or walk. You will need someone to be with you when you first try to ensure you do not fall and possibly risk injury.  4. Bruising and tenderness at the needle site are common side effects and will resolve in a few days.  5. Persistent numbness or new problems with movement should be communicated to the surgeon or the [facility] [phone] [facility] [phone]).

## 2015-05-30 NOTE — Brief Op Note (Signed)
05/30/2015  11:22 AM  PATIENT:  Jimmy Sharp  44 y.o. male  PRE-OPERATIVE DIAGNOSIS:  LEFT ANKLE SYNOVITIS  POST-OPERATIVE DIAGNOSIS:  LEFT ANKLE SYNOVITIS; extensor tenosynovitis  Procedure(s): LEFT ANKLE ARTHROSCOPY WITH EXTENSIVE DEBRIDEMENT AND MICROFRACTURE LEFT ANTERIOR ANKLE TENOLYSIS of tibialis anterior, extensor hallucis longus, extensor digitorum longus and peroneus tertius  SURGEON:  Toni Arthurs, MD  ASSISTANT:  Alfredo Martinez, PA-C  ANESTHESIA:   General, regional  EBL:  minimal   TOURNIQUET:   Total Tourniquet Time Documented: Thigh (Left) - 44 minutes Total: Thigh (Left) - 44 minutes  COMPLICATIONS:  None apparent  DISPOSITION:  Extubated, awake and stable to recovery.  DICTATION ID:  [id number]

## 2015-05-30 NOTE — Transfer of Care (Signed)
Immediate Anesthesia Transfer of Care Note  Patient: Jimmy Sharp  Procedure(s) Performed: Procedure(s): LEFT ANKLE ARTHROSCOPY WITH EXTENSIVE DEBRIDEMENT AND MICROFRACTURE (Left) LEFT ANTERIOR ANKLE TENOLYSIS (Left)  Patient Location: PACU  Anesthesia Type:GA combined with regional for post-op pain  Level of Consciousness: awake, alert , oriented and patient cooperative  Airway & Oxygen Therapy: Patient Spontanous Breathing and Patient connected to face mask oxygen  Post-op Assessment: Report given to RN and Post -op Vital signs reviewed and stable  Post vital signs: Reviewed and stable  Last Vitals:  Filed Vitals:   05/30/15 0925  BP:   Pulse: 83  Temp:   Resp: 17    Complications: No apparent anesthesia complications

## 2015-05-30 NOTE — Anesthesia Procedure Notes (Addendum)
Anesthesia Regional Block:  Popliteal block  Pre-Anesthetic Checklist: ,, timeout performed, Correct Patient, Correct Site, Correct Laterality, Correct Procedure, Correct Position, site marked, Risks and benefits discussed,  Surgical consent,  Pre-op evaluation,  At surgeon's request and post-op pain management  Laterality: Left and Lower  Prep: chloraprep       Needles:  Injection technique: Single-shot  Needle Type: Echogenic Needle     Needle Length: 9cm 9 cm Needle Gauge: 21 and 21 G    Additional Needles:  Procedures: ultrasound guided (picture in chart) Popliteal block Narrative:  Start time: 05/30/2015 9:03 AM End time: 05/30/2015 9:08 AM Injection made incrementally with aspirations every 5 mL.  Performed by: Personally  Anesthesiologist: CREWS, DAVID   Procedure Name: LMA Insertion Date/Time: 05/30/2015 10:10 AM Performed by: Quinisha Mould D Pre-anesthesia Checklist: Patient identified, Emergency Drugs available, Suction available and Patient being monitored Patient Re-evaluated:Patient Re-evaluated prior to inductionOxygen Delivery Method: Circle System Utilized Preoxygenation: Pre-oxygenation with 100% oxygen Intubation Type: IV induction Ventilation: Mask ventilation without difficulty LMA: LMA inserted LMA Size: 5.0 Number of attempts: 1 Airway Equipment and Method: Bite block Placement Confirmation: positive ETCO2 Tube secured with: Tape Dental Injury: Teeth and Oropharynx as per pre-operative assessment

## 2015-05-30 NOTE — H&P (Signed)
Jimmy Sharp is an 44 y.o. male.   Chief Complaint:  Left ankle pain HPI: 44 y/o male with persistent left ankle pain and mechanical symptoms presents today for left ankle arthroscopy and open tenolysis.  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  Past Medical History  Diagnosis Date  . Depression   . Anxiety   . Seizures   . GERD (gastroesophageal reflux disease)   . Synovitis of left ankle     Past Surgical History  Procedure Laterality Date  . Ligament repair Left     left ankle  . Knuckle surgery Right   . Knee arthroscopy Left   . Laparoscopic appendectomy N/A 04/14/2015    Procedure: APPENDECTOMY LAPAROSCOPIC;  Surgeon: Franky Macho Md, MD;  Location: [facility];  Service: General;  Laterality: N/A;  . Appendectomy      History reviewed. No pertinent family history. Social History:  reports that he has been smoking Cigarettes.  He has been smoking about 1.00 pack per day. He has quit using smokeless tobacco. His smokeless tobacco use included Chew. He reports that he does not drink alcohol or use illicit drugs.  Allergies: No Known Allergies  No prescriptions prior to admission    No results found for this or any previous visit (from the past 48 hour(s)). No results found.  ROS  No recent f/c/n/v/wt loss  Height 6' (1.829 m), weight 118.842 kg (262 lb). Physical Exam  wn wd male in n ad.  A and o x 4.  Mood and affect normal.  EOMI.  Resp unlabored.  L ankle with healthy skin.  No dysthesias or paresthesias.  5/5 strength in PF and DF of the ankle.  No lymphadenopathy.  Sens to LT intact dorsally and plantarly.  Assessment/Plan L ankle synovitis and tenosynovitis - to OR for left ankle arthroscopy and open extensor tenolysis.  The risks and benefits of the alternative treatment options have been discussed in detail.  The patient wishes to proceed with surgery and specifically understands risks of bleeding, infection, nerve damage, blood clots, need for additional surgery,  amputation and death.   Toni Arthurs 06/22/2015, 7:26 AM

## 2015-05-30 NOTE — Anesthesia Preprocedure Evaluation (Addendum)
Anesthesia Evaluation  Patient identified by MRN, date of birth, ID band Patient awake    Reviewed: Allergy & Precautions, NPO status , Patient's Chart, lab work & pertinent test results  Airway Mallampati: I  TM Distance: >3 FB Neck ROM: Full    Dental  (+) Teeth Intact, Dental Advisory Given   Pulmonary Current Smoker,  breath sounds clear to auscultation        Cardiovascular Rhythm:Regular Rate:Normal     Neuro/Psych Seizures -,  PSYCHIATRIC DISORDERS Anxiety Depression    GI/Hepatic GERD-  Medicated,  Endo/Other  Morbid obesity  Renal/GU      Musculoskeletal  (+) Arthritis -,   Abdominal   Peds  Hematology   Anesthesia Other Findings   Reproductive/Obstetrics                            Anesthesia Physical Anesthesia Plan  ASA: III  Anesthesia Plan: General and Regional   Post-op Pain Management:    Induction: Intravenous  Airway Management Planned: LMA  Additional Equipment:   Intra-op Plan:   Post-operative Plan: Extubation in OR  Informed Consent: I have reviewed the patients History and Physical, chart, labs and discussed the procedure including the risks, benefits and alternatives for the proposed anesthesia with the patient or authorized representative who has indicated his/her understanding and acceptance.   Dental advisory given  Plan Discussed with: CRNA, Anesthesiologist and Surgeon  Anesthesia Plan Comments:         Anesthesia Quick Evaluation

## 2015-05-30 NOTE — Anesthesia Postprocedure Evaluation (Signed)
  Anesthesia Post-op Note  Patient: Jimmy Sharp  Procedure(s) Performed: Procedure(s): LEFT ANKLE ARTHROSCOPY WITH EXTENSIVE DEBRIDEMENT AND MICROFRACTURE (Left) LEFT ANTERIOR ANKLE TENOLYSIS (Left)  Patient Location: PACU  Anesthesia Type: General, Regional   Level of Consciousness: awake, alert  and oriented  Airway and Oxygen Therapy: Patient Spontanous Breathing  Post-op Pain: mild  Post-op Assessment: Post-op Vital signs reviewed  Post-op Vital Signs: Reviewed  Last Vitals:  Filed Vitals:   05/30/15 1245  BP: 111/75  Pulse: 89  Temp:   Resp: 17    Complications: No apparent anesthesia complications

## 2015-05-30 NOTE — Progress Notes (Signed)
Assisted Dr. Crews with left, ultrasound guided, popliteal block. Side rails up, monitors on throughout procedure. See vital signs in flow sheet. Tolerated Procedure well. 

## 2015-05-31 ENCOUNTER — Encounter (HOSPITAL_BASED_OUTPATIENT_CLINIC_OR_DEPARTMENT_OTHER): Payer: Self-pay | Admitting: Orthopedic Surgery

## 2015-05-31 NOTE — Op Note (Signed)
NAME:  Jimmy, Sharp NO.:  [id number]  MEDICAL RECORD NO.:  [id number]  LOCATION:                                FACILITY:  [facility]  PHYSICIAN:  Toni Arthurs, MD        DATE OF BIRTH:  Nov 23, 1971  DATE OF PROCEDURE:  05/30/2015 DATE OF DISCHARGE:  05/30/2015                              OPERATIVE REPORT   PREOPERATIVE DIAGNOSES: 1. Left ankle synovitis. 2. Left ankle extensor tendon tenosynovitis.  POSTOPERATIVE DIAGNOSES: 1. Left ankle synovitis of the anterior medial and lateral gutters. 2. Left medial talar dome osteochondral lesion. 3. Tenosynovitis of the left anterior ankle tendons.  PROCEDURES: 1. Left ankle arthroscopy with extensive debridement. 2. Left ankle arthroscopy with microfracture of the medial talar dome     osteochondral lesion. 3. Tenolysis of the left anterior ankle tendons including tibialis     anterior, extensor hallucis longus, extensor digitorum longus and     peroneus tertius.  SURGEON:  Toni Arthurs, MD.  ASSISTANT:  Alfredo Martinez, PA-C.  ANESTHESIA:  General, regional.  ESTIMATED BLOOD LOSS:  Minimal.  TOURNIQUET TIME:  44 minutes at 250 mmHg.  COMPLICATIONS:  None apparent.  DISPOSITION:  Extubated, awake and stable to recovery.  INDICATIONS FOR PROCEDURE:  The patient is a 44 year old male with history of left ankle instability after a work injury.  He underwent lateral ligament reconstruction as well as ankle arthroscopy.  His postoperative course was complicated by complex regional pain syndrome. He also developed painful popping about the left ankle.  At this point, his complex regional pain symptoms have resolved.  He presents now for operative treatment of this painful ankle popping.  This most likely results from either synovitis within the ankle joint or tenosynovitis along the extensor tendons.  He understands the risks and benefits, the alternative treatment options, and elects surgical treatment.   He specifically understands risks of bleeding, infection, nerve damage, blood clots, need for additional surgery, continued pain, amputation, and death.  PROCEDURE IN DETAIL:  After preoperative consent was obtained and the correct operative site was identified, the patient was brought to the operating room and placed supine on the operating table.  General anesthesia was induced.  Preoperative antibiotics were administered. Surgical time-out was taken.  Left lower extremity was prepped and draped in standard sterile fashion with tourniquet around the thigh. Positioned in a well leg holder.  The left lower extremity was prepped and draped in standard sterile fashion.  The foot was placed in a traction strap.  Gentle longitudinal traction was placed on the ankle joint.  Previous anteromedial arthroscopy portal was incised and opened with a hemostat.  The arthroscope was inserted into the ankle joint.  An anterolateral arthroscopy portal was established under direct vision at the site of the previous arthroscopy portal.  The arthroscopic probe was inserted.  The anterior gutter was inspected.  There was some synovitis noted and a large fold of redundant synovial tissue at the anterior tibial lip.  The lateral gutter was inspected and was noted to have some tenosynovitis.  The arthroscopic shaver was introduced and was used to remove the tenosynovitis laterally.  The syndesmosis was noted to  be stable.  The articular cartilage of the lateral talar dome, lateral tibial plafond and lateral malleolus was all healthy and intact as was the cartilage within the lateral gutter.  The posterior gutter was inspected, there was no evidence of tenosynovitis or loose body.  The central portion of the tibial plafond and talar dome were both noted to have a healthy-appearing cartilage.  Medial gutter was then inspected. The patient was noted to have an osteochondral lesion of the medial talar dome that  had a loose flap of cartilage.  The loose cartilage was all debrided with a shaver.  This left a lesion that measured 1 cm x 2 cm.  The decision was made at that time to proceed with microfracture. Microfracture awl was then used to perforate through the subchondral bone within the lesion.  The medial gutter synovitis was all resected with the shaver.  All arthroscopic instruments were then removed.  The leg was then taken out of the traction strap and well leg holder. An anterior incision was made over the extensor hallucis longus tendon. Sharp dissection was carried down through the skin and subcutaneous tissue.  Care was taken to protect branches of the superficial peroneal nerve.  The extensor retinaculum was incised.  There was significant tenosynovitis noted along the tibialis anterior, extensor hallucis longus, extensor digitorum longus and peroneus tertius.  All this was sharply excised with scissors.  The wound was irrigated copiously.  The neurovascular bundle was noted to be healthy and intact.  The retinaculum was repaired with simple sutures of 0 Vicryl.  The anterior fascia was repaired with a running 0 Vicryl suture.  Subcutaneous tissues were approximated with inverted simple sutures of 3-0 Monocryl and a running 3-0 nylon was used to close the skin incision.  Sterile dressings were applied followed by well-padded compression wrap and a CAM walker boot.  Tourniquet was released at 44 minutes after application of the dressings.  The patient was awakened from anesthesia and transported to the recovery room in stable condition.  FOLLOWUP PLAN:  The patient will be nonweightbearing in the CAM boot. He will remove it twice a day for range of motion exercises.  He will follow up with me in the office in 2 weeks for suture removal.  Alfredo Martinez, PA-C was present and scrubbed for the duration of the case.  His assistance was essential in positioning of the patient, prepping  and draping, performing the operation, closing and dressing of the wounds, and applying the CAM boot.     Toni Arthurs, MD     JH/MEDQ  D:  05/30/2015  T:  05/30/2015  Job:  [id number]

## 2015-10-22 HISTORY — PX: ANKLE FUSION: SHX881

## 2015-11-26 ENCOUNTER — Emergency Department (HOSPITAL_COMMUNITY)
Admission: EM | Admit: 2015-11-26 | Discharge: 2015-11-26 | Disposition: A | Discharge status: Home / Self Care | Payer: 59 | Attending: Emergency Medicine | Admitting: Emergency Medicine

## 2015-11-26 ENCOUNTER — Encounter (HOSPITAL_COMMUNITY): Payer: Self-pay | Admitting: *Deleted

## 2015-11-26 ENCOUNTER — Emergency Department (HOSPITAL_COMMUNITY): Payer: 59

## 2015-11-26 DIAGNOSIS — F419 Anxiety disorder, unspecified: Secondary | ICD-10-CM | POA: Diagnosis not present

## 2015-11-26 DIAGNOSIS — Z79899 Other long term (current) drug therapy: Secondary | ICD-10-CM | POA: Diagnosis not present

## 2015-11-26 DIAGNOSIS — K219 Gastro-esophageal reflux disease without esophagitis: Secondary | ICD-10-CM | POA: Insufficient documentation

## 2015-11-26 DIAGNOSIS — N200 Calculus of kidney: Secondary | ICD-10-CM | POA: Diagnosis not present

## 2015-11-26 DIAGNOSIS — F329 Major depressive disorder, single episode, unspecified: Secondary | ICD-10-CM | POA: Diagnosis not present

## 2015-11-26 DIAGNOSIS — R109 Unspecified abdominal pain: Secondary | ICD-10-CM

## 2015-11-26 DIAGNOSIS — F17210 Nicotine dependence, cigarettes, uncomplicated: Secondary | ICD-10-CM | POA: Insufficient documentation

## 2015-11-26 DIAGNOSIS — Z8739 Personal history of other diseases of the musculoskeletal system and connective tissue: Secondary | ICD-10-CM | POA: Diagnosis not present

## 2015-11-26 LAB — URINALYSIS, ROUTINE W REFLEX MICROSCOPIC
Bilirubin Urine: NEGATIVE
GLUCOSE, UA: NEGATIVE mg/dL
Ketones, ur: NEGATIVE mg/dL
Nitrite: NEGATIVE
PH: 6 (ref 5.0–8.0)
Protein, ur: NEGATIVE mg/dL
Specific Gravity, Urine: <1.005 — ABNORMAL LOW (ref 1.005–1.030)

## 2015-11-26 LAB — URINE MICROSCOPIC-ADD ON

## 2015-11-26 MED ORDER — HYDROMORPHONE HCL 1 MG/ML IJ SOLN
1.0000 mg | Freq: Once | INTRAMUSCULAR | Status: AC
  Administered 2015-11-26: 1 mg via INTRAVENOUS
  Filled 2015-11-26: qty 1

## 2015-11-26 MED ORDER — TAMSULOSIN HCL 0.4 MG PO CAPS: 0.4000 mg | ORAL_CAPSULE | Freq: Every day | ORAL | Status: DC

## 2015-11-26 MED ORDER — OXYCODONE-ACETAMINOPHEN 5-325 MG PO TABS: 1.0000 | ORAL_TABLET | ORAL | Status: DC | PRN

## 2015-11-26 MED ORDER — SODIUM CHLORIDE 0.9 % IV BOLUS (SEPSIS)
500.0000 mL | Freq: Once | INTRAVENOUS | Status: AC
  Administered 2015-11-26: 500 mL via INTRAVENOUS

## 2015-11-26 MED ORDER — ONDANSETRON HCL 4 MG/2ML IJ SOLN
4.0000 mg | Freq: Once | INTRAMUSCULAR | Status: AC
  Administered 2015-11-26: 4 mg via INTRAVENOUS
  Filled 2015-11-26: qty 2

## 2015-11-26 MED ORDER — KETOROLAC TROMETHAMINE 30 MG/ML IJ SOLN
30.0000 mg | Freq: Once | INTRAMUSCULAR | Status: AC
Start: 2015-11-26 — End: 2015-11-26
  Administered 2015-11-26: 30 mg via INTRAVENOUS
  Filled 2015-11-26: qty 1

## 2015-11-26 MED ORDER — ONDANSETRON HCL 8 MG PO TABS: 8.0000 mg | ORAL_TABLET | ORAL | Status: DC | PRN

## 2015-11-26 NOTE — ED Notes (Signed)
Pt states he has been having left flank pain starting 11/24/15. In addition, pt has had cloudy urine.

## 2015-11-26 NOTE — ED Provider Notes (Signed)
CSN: [id number]     Arrival date & time 11/26/15  1642 History   First MD Initiated Contact with Patient 11/26/15 1659     Chief Complaint  Patient presents with  . Flank Pain     (Consider location/radiation/quality/duration/timing/severity/associated sxs/prior Treatment) HPI....Marland Kitchen. Abrupt onset of left flank pain for several hours. No previous history of kidney stones. No dysuria, hematuria, frequency. Severity of pain is moderate to severe. Nothing makes symptoms better or worse.  Past Medical History  Diagnosis Date  . Depression   . Anxiety   . Seizures (HCC)   . GERD (gastroesophageal reflux disease)   . Synovitis of left ankle    Past Surgical History  Procedure Laterality Date  . Ligament repair Left     left ankle  . Knuckle surgery Right   . Knee arthroscopy Left   . Laparoscopic appendectomy N/A 04/14/2015    Procedure: APPENDECTOMY LAPAROSCOPIC;  Surgeon: Franky MachoMark Jenkins Md, MD;  Location: [facility];  Service: General;  Laterality: N/A;  . Appendectomy    . Ankle arthroscopy with drilling/microfracture Left 05/30/2015    Procedure: LEFT ANKLE ARTHROSCOPY WITH EXTENSIVE DEBRIDEMENT AND MICROFRACTURE;  Surgeon: Toni ArthursJohn Hewitt, MD;  Location: [facility];  Service: Orthopedics;  Laterality: Left;  . Tenolysis Left 05/30/2015    Procedure: LEFT ANTERIOR ANKLE TENOLYSIS;  Surgeon: Toni ArthursJohn Hewitt, MD;  Location: [facility];  Service: Orthopedics;  Laterality: Left;  . Ankle fusion  10/2015   No family history on file. Social History  Substance Use Topics  . Smoking status: Current Every Day Smoker -- 1.00 packs/day    Types: Cigarettes  . Smokeless tobacco: Former NeurosurgeonUser    Types: Chew  . Alcohol Use: No    Review of Systems  All other systems reviewed and are negative.     Allergies  Review of patient's allergies indicates no known allergies.  Home Medications   Prior to Admission medications   Medication Sig Start Date End Date Taking?  Authorizing Provider  LORazepam (ATIVAN) 0.5 MG tablet Take 0.5 mg by mouth 3 (three) times daily as needed for anxiety.   Yes Historical Provider, MD  omeprazole (PRILOSEC) 20 MG capsule Take 20 mg by mouth daily.   Yes Historical Provider, MD  oxymetazoline (AFRIN) 0.05 % nasal spray Place 1 spray into both nostrils 2 (two) times daily as needed for congestion.   Yes Historical Provider, MD  topiramate (TOPAMAX) 25 MG capsule Take 25 mg by mouth daily.   Yes Historical Provider, MD  ondansetron (ZOFRAN) 8 MG tablet Take 1 tablet (8 mg total) by mouth every 4 (four) hours as needed. 11/26/15   Donnetta HutchingBrian Belita Warsame, MD  oxyCODONE-acetaminophen (PERCOCET) 5-325 MG tablet Take 1-2 tablets by mouth every 4 (four) hours as needed. 11/26/15   Donnetta HutchingBrian Junelle Hashemi, MD  oxyCODONE-acetaminophen (PERCOCET) 5-325 MG tablet Take 1-2 tablets by mouth every 4 (four) hours as needed. 11/26/15   Donnetta HutchingBrian Callyn Severtson, MD  tamsulosin (FLOMAX) 0.4 MG CAPS capsule Take 1 capsule (0.4 mg total) by mouth daily. 11/26/15   Donnetta HutchingBrian Bensyn Bornemann, MD   BP 136/84 mmHg  Pulse 74  Temp(Src) 98.2 F (36.8 C) (Oral)  Resp 18  Ht 6\' 1"  (1.854 m)  Wt 265 lb (120.203 kg)  BMI 34.97 kg/m2  SpO2 98% Physical Exam  Constitutional: He is oriented to person, place, and time. He appears well-developed and well-nourished.  HENT:  Head: Normocephalic and atraumatic.  Eyes: Conjunctivae and EOM are normal. Pupils are equal, round, and  reactive to light.  Neck: Normal range of motion. Neck supple.  Cardiovascular: Normal rate and regular rhythm.   Pulmonary/Chest: Effort normal and breath sounds normal.  Abdominal: Soft. Bowel sounds are normal.  Minimal tenderness left lower quadrant  Genitourinary:  Minimal tenderness left flank  Musculoskeletal: Normal range of motion.  Neurological: He is alert and oriented to person, place, and time.  Skin: Skin is warm and dry.  Psychiatric: He has a normal mood and affect. His behavior is normal.  Nursing note and vitals  reviewed.   ED Course  Procedures (including critical care time) Labs Review Labs Reviewed  URINALYSIS, ROUTINE W REFLEX MICROSCOPIC (NOT AT [facility]) - Abnormal; Notable for the following:    Specific Gravity, Urine <1.005 (*)    Hgb urine dipstick LARGE (*)    Leukocytes, UA TRACE (*)    All other components within normal limits  URINE MICROSCOPIC-ADD ON - Abnormal; Notable for the following:    Squamous Epithelial / LPF 0-5 (*)    Bacteria, UA RARE (*)    All other components within normal limits    Imaging Review Ct Renal Stone Study  11/26/2015  CLINICAL DATA:  Left flank pain starting on 11/24/2015. Cloudy urine. Emesis. History of prior renal calculi. EXAM: CT ABDOMEN AND PELVIS WITHOUT CONTRAST TECHNIQUE: Multidetector CT imaging of the abdomen and pelvis was performed following the standard protocol without IV contrast. COMPARISON:  04/14/2015 FINDINGS: Lower chest:  Unremarkable Hepatobiliary: Unremarkable Pancreas: Unremarkable Spleen: Unremarkable Adrenals/Urinary Tract: Adrenal glands normal. 5 by 2 by 2 mm left UPJ calculus associated with mild left perirenal stranding and stranding around the left collecting system, but without overt hydronephrosis. In addition there several nonobstructive calculi including an 8 by 4 mm left mid kidney calculus and a 5 by 2 mm left kidney lower pole calculus. No distal ureteral calculus or bladder calculus seen. Stomach/Bowel: Sigmoid colon diverticulosis.  Appendectomy. Vascular/Lymphatic: Unremarkable Reproductive: Curvilinear calcification along the margins of the central zone prostate gland. Other: No supplemental non-categorized findings. Musculoskeletal: 3 mm degenerative retrolisthesis at L3-4 and L4-5. A transitional lumbosacral vertebra is assumed to represent the S1 level. IMPRESSION: 1. 5 by 2 by 2 mm left UPJ calculus associated with mild left perirenal stranding and stranding around the left collecting system, but no overt hydronephrosis.  This may be intermittently obstructing ; the degree of stranding is less than I would expect if the patient had experienced forniceal rupture. There also nonobstructive left-sided renal calculi. 2. Sigmoid colon diverticulosis. Electronically Signed   By: Gaylyn Rong M.D.   On: 11/26/2015 18:37   I have personally reviewed and evaluated these images and lab results as part of my medical decision-making.   EKG Interpretation None      MDM   Final diagnoses:  Left flank pain  Kidney stone on left side   CT scan reveals a 5 x 2 x 2 mm left UPJ calculus with some perirenal stranding.  These findings were discussed with the patient and the urologist on call. He will get urology follow-up. Discharge medications include Flomax 0.4 mg, Percocet, Zofran 8 mg.    Donnetta Hutching, MD 11/28/15 [phone]

## 2015-11-26 NOTE — ED Notes (Signed)
Pt verbalized understanding of no driving and to use caution within 4 hours of taking pain meds due to meds cause drowsiness 

## 2015-11-26 NOTE — ED Notes (Signed)
Pt states he has had 7 episodes of emesis.

## 2015-11-26 NOTE — Discharge Instructions (Signed)
You have a 5 x 2 x 2 mm kidney stone. Call [facility] at [phone] morning to schedule an appointment. Remind in the urine the emergency department. Prescription for pain medicine, nausea medicine, Flomax to increase your urinary flow.

## 2015-12-03 MED FILL — Oxycodone w/ Acetaminophen Tab 5-325 MG: ORAL | Qty: 6 | Status: AC

## 2015-12-24 DIAGNOSIS — N201 Calculus of ureter: Secondary | ICD-10-CM | POA: Diagnosis not present

## 2015-12-31 DIAGNOSIS — N200 Calculus of kidney: Secondary | ICD-10-CM | POA: Diagnosis not present

## 2015-12-31 DIAGNOSIS — N23 Unspecified renal colic: Secondary | ICD-10-CM | POA: Diagnosis not present

## 2015-12-31 DIAGNOSIS — F410 Panic disorder [episodic paroxysmal anxiety] without agoraphobia: Secondary | ICD-10-CM | POA: Diagnosis not present

## 2015-12-31 DIAGNOSIS — Z6835 Body mass index (BMI) 35.0-35.9, adult: Secondary | ICD-10-CM | POA: Diagnosis not present

## 2016-01-02 DIAGNOSIS — R030 Elevated blood-pressure reading, without diagnosis of hypertension: Secondary | ICD-10-CM | POA: Diagnosis not present

## 2016-01-02 DIAGNOSIS — R569 Unspecified convulsions: Secondary | ICD-10-CM | POA: Diagnosis not present

## 2016-01-02 DIAGNOSIS — M542 Cervicalgia: Secondary | ICD-10-CM | POA: Diagnosis not present

## 2016-01-02 DIAGNOSIS — R000 Tachycardia, unspecified: Secondary | ICD-10-CM | POA: Diagnosis not present

## 2016-01-02 DIAGNOSIS — Z79899 Other long term (current) drug therapy: Secondary | ICD-10-CM | POA: Diagnosis not present

## 2016-01-02 DIAGNOSIS — K219 Gastro-esophageal reflux disease without esophagitis: Secondary | ICD-10-CM | POA: Diagnosis not present

## 2016-01-02 DIAGNOSIS — H542 Low vision, both eyes: Secondary | ICD-10-CM | POA: Diagnosis not present

## 2016-02-04 MED FILL — TROKENDI XR 25 MG CAPSULE: 25 | 30 days supply | Qty: 60 | Fill #0

## 2016-03-05 MED FILL — TROKENDI XR 25 MG CAPSULE: 25 | 30 days supply | Qty: 60 | Fill #1

## 2016-03-24 ENCOUNTER — Encounter: Payer: Self-pay | Admitting: Pain Medicine

## 2016-03-24 ENCOUNTER — Ambulatory Visit: Payer: Worker's Compensation | Attending: Pain Medicine | Admitting: Pain Medicine

## 2016-03-24 VITALS — BP 130/88 | HR 81 | Temp 98.6°F | Resp 16 | Ht 73.0 in | Wt 235.0 lb

## 2016-03-24 DIAGNOSIS — M79673 Pain in unspecified foot: Secondary | ICD-10-CM | POA: Insufficient documentation

## 2016-03-24 DIAGNOSIS — E538 Deficiency of other specified B group vitamins: Secondary | ICD-10-CM | POA: Diagnosis not present

## 2016-03-24 DIAGNOSIS — G4700 Insomnia, unspecified: Secondary | ICD-10-CM | POA: Insufficient documentation

## 2016-03-24 DIAGNOSIS — G40909 Epilepsy, unspecified, not intractable, without status epilepticus: Secondary | ICD-10-CM | POA: Diagnosis not present

## 2016-03-24 DIAGNOSIS — K219 Gastro-esophageal reflux disease without esophagitis: Secondary | ICD-10-CM | POA: Diagnosis not present

## 2016-03-24 DIAGNOSIS — M25572 Pain in left ankle and joints of left foot: Secondary | ICD-10-CM | POA: Diagnosis not present

## 2016-03-24 DIAGNOSIS — F419 Anxiety disorder, unspecified: Secondary | ICD-10-CM | POA: Insufficient documentation

## 2016-03-24 DIAGNOSIS — G90522 Complex regional pain syndrome I of left lower limb: Secondary | ICD-10-CM | POA: Diagnosis not present

## 2016-03-24 DIAGNOSIS — G8929 Other chronic pain: Secondary | ICD-10-CM | POA: Diagnosis not present

## 2016-03-24 DIAGNOSIS — F17210 Nicotine dependence, cigarettes, uncomplicated: Secondary | ICD-10-CM | POA: Insufficient documentation

## 2016-03-24 DIAGNOSIS — F329 Major depressive disorder, single episode, unspecified: Secondary | ICD-10-CM | POA: Insufficient documentation

## 2016-03-24 DIAGNOSIS — Z79891 Long term (current) use of opiate analgesic: Secondary | ICD-10-CM | POA: Diagnosis not present

## 2016-03-24 DIAGNOSIS — Z5181 Encounter for therapeutic drug level monitoring: Secondary | ICD-10-CM

## 2016-03-24 DIAGNOSIS — Z79899 Other long term (current) drug therapy: Secondary | ICD-10-CM

## 2016-03-24 DIAGNOSIS — E559 Vitamin D deficiency, unspecified: Secondary | ICD-10-CM

## 2016-03-24 DIAGNOSIS — M25579 Pain in unspecified ankle and joints of unspecified foot: Secondary | ICD-10-CM | POA: Diagnosis present

## 2016-03-24 DIAGNOSIS — M89062 Algoneurodystrophy, left lower leg: Secondary | ICD-10-CM | POA: Diagnosis not present

## 2016-03-24 DIAGNOSIS — F1190 Opioid use, unspecified, uncomplicated: Secondary | ICD-10-CM | POA: Insufficient documentation

## 2016-03-24 DIAGNOSIS — Z0189 Encounter for other specified special examinations: Secondary | ICD-10-CM

## 2016-03-24 NOTE — Patient Instructions (Signed)
Smoking Cessation, Tips for Success If you are ready to quit smoking, congratulations! You have chosen to help yourself be healthier. Cigarettes bring nicotine, tar, carbon monoxide, and other irritants into your body. Your lungs, heart, and blood vessels will be able to work better without these poisons. There are many different ways to quit smoking. Nicotine gum, nicotine patches, a nicotine inhaler, or nicotine nasal spray can help with physical craving. Hypnosis, support groups, and medicines help break the habit of smoking. WHAT THINGS CAN I DO TO MAKE QUITTING EASIER?  Here are some tips to help you quit for good:  Pick a date when you will quit smoking completely. Tell all of your friends and family about your plan to quit on that date.  Do not try to slowly cut down on the number of cigarettes you are smoking. Pick a quit date and quit smoking completely starting on that day.  Throw away all cigarettes.   Clean and remove all ashtrays from your home, work, and car.  On a card, write down your reasons for quitting. Carry the card with you and read it when you get the urge to smoke.  Cleanse your body of nicotine. Drink enough water and fluids to keep your urine clear or pale yellow. Do this after quitting to flush the nicotine from your body.  Learn to predict your moods. Do not let a bad situation be your excuse to have a cigarette. Some situations in your life might tempt you into wanting a cigarette.  Never have "just one" cigarette. It leads to wanting another and another. Remind yourself of your decision to quit.  Change habits associated with smoking. If you smoked while driving or when feeling stressed, try other activities to replace smoking. Stand up when drinking your coffee. Brush your teeth after eating. Sit in a different chair when you read the paper. Avoid alcohol while trying to quit, and try to drink fewer caffeinated beverages. Alcohol and caffeine may urge you to  smoke.  Avoid foods and drinks that can trigger a desire to smoke, such as sugary or spicy foods and alcohol.  Ask people who smoke not to smoke around you.  Have something planned to do right after eating or having a cup of coffee. For example, plan to take a walk or exercise.  Try a relaxation exercise to calm you down and decrease your stress. Remember, you may be tense and nervous for the first 2 weeks after you quit, but this will pass.  Find new activities to keep your hands busy. Play with a pen, coin, or rubber band. Doodle or draw things on paper.  Brush your teeth right after eating. This will help cut down on the craving for the taste of tobacco after meals. You can also try mouthwash.   Use oral substitutes in place of cigarettes. Try using lemon drops, carrots, cinnamon sticks, or chewing gum. Keep them handy so they are available when you have the urge to smoke.  When you have the urge to smoke, try deep breathing.  Designate your home as a nonsmoking area.  If you are a heavy smoker, ask your health care provider about a prescription for nicotine chewing gum. It can ease your withdrawal from nicotine.  Reward yourself. Set aside the cigarette money you save and buy yourself something nice.  Look for support from others. Join a support group or smoking cessation program. Ask someone at home or at work to help you with your plan   to quit smoking.  Always ask yourself, "Do I need this cigarette or is this just a reflex?" Tell yourself, "Today, I choose not to smoke," or "I do not want to smoke." You are reminding yourself of your decision to quit.  Do not replace cigarette smoking with electronic cigarettes (commonly called e-cigarettes). The safety of e-cigarettes is unknown, and some may contain harmful chemicals.  If you relapse, do not give up! Plan ahead and think about what you will do the next time you get the urge to smoke. HOW WILL I FEEL WHEN I QUIT SMOKING? You  may have symptoms of withdrawal because your body is used to nicotine (the addictive substance in cigarettes). You may crave cigarettes, be irritable, feel very hungry, cough often, get headaches, or have difficulty concentrating. The withdrawal symptoms are only temporary. They are strongest when you first quit but will go away within 10-14 days. When withdrawal symptoms occur, stay in control. Think about your reasons for quitting. Remind yourself that these are signs that your body is healing and getting used to being without cigarettes. Remember that withdrawal symptoms are easier to treat than the major diseases that smoking can cause.  Even after the withdrawal is over, expect periodic urges to smoke. However, these cravings are generally short lived and will go away whether you smoke or not. Do not smoke! WHAT RESOURCES ARE AVAILABLE TO HELP ME QUIT SMOKING? Your health care provider can direct you to community resources or hospitals for support, which may include:  Group support.  Education.  Hypnosis.  Therapy.   This information is not intended to replace advice given to you by your health care provider. Make sure you discuss any questions you have with your health care provider.   Document Released: 09/04/2004 Document Revised: 12/28/2014 Document Reviewed: 05/25/2013 [employer] Interactive Patient Education 2016 [employer]  

## 2016-03-24 NOTE — Progress Notes (Signed)
Patient's Name: JARONE OSTERGAARD  Patient type: New  MRN: [id number]  Service setting: Ambulatory outpatient  DOB: Dec 01, 1971    DOS: 03/24/2016     Primary Reason(s) for Visit: Initial Patient Evaluation CC: Ankle Pain and Foot Pain   HPI  Mr. Sequeira is a 45 y.o. year old, male patient, who comes today for an initial evaluation. He has Ankle impingement syndrome; Bone disease; Pain in testicle; Discomfort; Cyanocobalamine deficiency (non anemic); Avitaminosis D; Chronic pain; Chronic ankle pain (Left); Complex regional pain syndrome type 1 affecting left lower leg; Long term current use of opiate analgesic; Long term prescription opiate use; Opiate use; Encounter for therapeutic drug level monitoring; and Encounter for pain management planning on his problem list.. His primarily concern today is the Ankle Pain and Foot Pain   Pain Assessment: Self-Reported Pain Score: 6  Reported level is compatible with observation Pain Type: Chronic pain Pain Location: Ankle Pain Orientation: Left Pain Descriptors / Indicators: Burning, Sharp Pain Frequency: Constant  Onset and Duration: Sudden, Started with work-related injury, Date of injury: 09/01/2012 and Present longer than 3 months Cause of pain: Worker's Compensation Injury Severity: Getting worse, NAS-11 at its worse: 10/10, NAS-11 at its best: 4/10, NAS-11 now: 6/10 and NAS-11 on the average: 6-7/10 Timing: Not influenced by the time of the day Aggravating Factors: Kneeling, Prolonged sitting, Prolonged standing, Squatting, Surgery made it worse, Walking, Walking uphill and Walking downhill Alleviating Factors: Nerve blocks Associated Problems: Color changes, Depression, Fatigue, Spasms, Sweating, Swelling, Temperature changes, Tingling, Pain that wakes patient up and Pain that does not allow patient to sleep Quality of Pain: Aching, Agonizing, Burning, Deep, Hot, Pulsating, Shooting and Tingling Previous Examinations or Tests: MRI scan, Nerve  block, X-rays, Orthoperdic evaluation and Psychiatric evaluation Previous Treatments: Narcotic medications, Physical Therapy and TENS. In addition the patient has had numerous lumbar sympathetic blocks. He indicates that he has had 3 flareups of his CRPS. The first 2 were control with the lumbar sympathetic blocks where he would obtain 90-100% relief of the pain and that would last for a month with increasing periods of benefit until the CRPS would simply not come back. Unfortunately, after the ankle fusion they no longer seems to work. At best, they will given 1/2-2 days of 50% relief of the pain. He indicates that the last 10 or so have been like that. Until now these treatments have been done by Dr. Yolanda Bonine but he indicates that he has also seen Dr. Ethelene Hal at [facility] orth[facility]ics. He indicates having had 4 ankle surgeries the first one was done by a physician that is no longer in town in the last 3 have been done by Dr. Victorino Dike. The last surgery was done on 10/13/2015 and it was an ankle fusion.  The patient comes in today clinics today for the first time  Historic Controlled Substance Pharmacotherapy Review  Previously Prescribed Opioids: Hydrocodone/APAP 10/325 one twice a day (20 mg/day) Currently Prescribed Analgesic: Hydrocodone/APAP 10/325 one twice a day (20 mg/day) Medications: Patient brought medications to be checked, as requested MME/day: 20 mg/day Pharmacodynamics: Analgesic Effect: More than 50% Activity Facilitation: Medication(s) allow patient to sit, stand, walk, and do the basic ADLs Perceived Effectiveness: Described as relatively effective, allowing for increase in activities of daily living (ADL) Side-effects or Adverse reactions: None reported Historical Background Evaluation: [place] PDMP: Five (5) year initial data search conducted. No abnormal patterns identified [place] [employer] Offender Public Information: Non-contributory Historical Hospital-associated UDS  Results:   Lab  Results  Component Value Date   THCU NONE DETECTED 04/07/2015   COCAINSCRNUR NONE DETECTED 04/07/2015   AMPHETMU NONE DETECTED 04/07/2015   UDS Results: No UDS results available at this time UDS Interpretation: N/A Medication Assessment Form: Not applicable. Initial evaluation. The patient has not received any medications from our practice Treatment compliance: Not applicable. Initial evaluation Risk Assessment: Aberrant Behavior: None observed today Opioid Fatal Overdose Risk Factors: Male gender and Caucasian Non-fatal overdose hazard ratio (HR): 1.44 for 20-49 MME/day Fatal overdose hazard ratio (HR): 1.32 for 20-49 MME/day Substance Use Disorder (SUD) Risk Level: Pending results of Medical Psychology Evaluation for SUD Opioid Risk Tool (ORT) Score: Total Score: 1 Low Risk for SUD (Score <3) Depression Scale Score: PHQ-2: PHQ-2 Total Score: 0 No depression (0) PHQ-9: PHQ-9 Total Score: 0 No depression (0-4)  Pharmacologic Plan: Pending ordered tests and/or consults  Meds  The patient has a current medication list which includes the following prescription(s): guaifenesin-codeine, hydrocodone-acetaminophen, lorazepam, lyrica, omeprazole, oxymetazoline, trokendi xr, and oxycodone-acetaminophen.  ROS  Cardiovascular History: Negative for hypertension, coronary artery diseas, myocardial infraction, anticoagulant therapy or heart failure Pulmonary or Respiratory History: Smoker and Snoring  Neurological History: Seizure disorder Psychological-Psychiatric History: Anxiety, Panic Attacks and Insomnia Gastrointestinal History: Ulcers and Reflux or heatburn Genitourinary History: Nephrolithiasis Hematological History: Negative for anticoagulant therapy, anemia, bruising or bleeding easily, hemophilia, sickle cell disease or trait, thrombocytopenia or coagulupathies Endocrine History: Negative for diabetes or thyroid disease Rheumatologic History: Negative for lupus,  osteoarthritis, rheumatoid arthritis, myositis, polymyositis or fibromyagia Musculoskeletal History: Negative for myasthenia gravis, muscular dystrophy, multiple sclerosis or malignant hyperthermia Work History: Unemployed and Out of work due to pain. The patient has been unemployed for the past 2 years and has not worked for the past 4 years.  Allergies  Mr. Matton has No Known Allergies.  PFSH  Medical:  Mr. Aman  has a past medical history of Depression; Anxiety; Seizures (HCC); GERD (gastroesophageal reflux disease); and Synovitis of left ankle. Family: family history includes Heart disease in his father; Parkinsonism in his mother. Surgical:  has past surgical history that includes Ligament repair (Left); knuckle surgery (Right); Knee arthroscopy (Left); laparoscopic appendectomy (N/A, 04/14/2015); Appendectomy; Ankle arthroscopy with drilling/microfracture (Left, 05/30/2015); Tenolysis (Left, 05/30/2015); and Ankle Fusion (10/2015). Tobacco:  reports that he has been smoking Cigarettes.  He has been smoking about 1.00 pack per day. He has quit using smokeless tobacco. His smokeless tobacco use included Chew. Alcohol:  reports that he does not drink alcohol. Drug:  reports that he does not use illicit drugs.  Physical Examination  Constitutional Vitals:  Today's Vitals   03/24/16 1134 03/24/16 1136  BP:  130/88  Pulse: 81   Temp: 98.6 F (37 C)   Resp: 16   Height: 6\' 1"  (1.854 m)   Weight: 235 lb (106.595 kg)   SpO2: 98%   PainSc: 6  6   PainLoc: Ankle    Calculated BMI: Body mass index is 31.01 kg/(m^2). Obese (Class I) (30-34.9 kg/m2) - 68% higher incidence of chronic pain General appearance: alert, cooperative, appears stated age and no distress Eyes: PERLA Respiratory: No evidence respiratory distress, no audible rales or ronchi and no use of accessory muscles of respiration  Cervical Spine Exam  Inspection: Normal anatomy, no anomalies observed Cervical Lordosis:  Normal Alignment: Symetrical Functional ROM: Within functional limits (WFL) AROM: WFL Sensory: No sensory abnormalities reported  Upper Extremity Exam   Right  Left  Inspection: No gross anomalies detected  Inspection: No  gross anomalies detected  AROM: Adequate  AROM: Adequate  Sensory: Normal  Sensory: Normal  Motor: Unremarkable       Motor: Unremarkable        Thoracic Spine  Inspection: No gross anomalies detected Alignment: Symetrical AROM: Adequate  Lumbar Spne  Inspection: No gross anomalies detected Alignment: Symetrical AROM: Adequate  Gait Assessment  Gait: WNL  Lower Extremities   Right  Left  Inspection: No gross anomalies detected  Inspection: No gross anomalies detected Surgical scars from fusion.  AROM: Adequate  AROM: Restricted (Joint is fussed)  Sensory:  Normal 87 top / 86 bottom  Sensory:  Normal 87 top / 86 bottom  Motor: Unremarkable       Motor: Unremarkable        Assessment  Primary Diagnosis & Pertinent Problem List: The primary encounter diagnosis was Chronic pain. Diagnoses of Chronic ankle pain (Left), Complex regional pain syndrome type 1 affecting left lower leg, Long term current use of opiate analgesic, Long term prescription opiate use, Opiate use, Encounter for therapeutic drug level monitoring, Encounter for pain management planning, Avitaminosis D, and Cyanocobalamine deficiency (non anemic) were also pertinent to this visit.  Visit Diagnosis: 1. Chronic pain   2. Chronic ankle pain (Left)   3. Complex regional pain syndrome type 1 affecting left lower leg   4. Long term current use of opiate analgesic   5. Long term prescription opiate use   6. Opiate use   7. Encounter for therapeutic drug level monitoring   8. Encounter for pain management planning   9. Avitaminosis D   10. Cyanocobalamine deficiency (non anemic)     Assessment: No problem-specific assessment & plan notes found for this encounter.   Plan of Care   Initial Treatment Plan:  Please be advised that as per protocol, today's visit has been an evaluation only. We have not taken over the patient's controlled substance management.  Pharmacotherapy (Medications Ordered): No orders of the defined types were placed in this encounter.    Lab-work & Procedure Ordered: Orders Placed This Encounter  Procedures  . DG Ankle Complete Left    Standing Status: Future     Number of Occurrences:      Standing Expiration Date: 05/24/2017    Order Specific Question:  Reason for Exam (SYMPTOM  OR DIAGNOSIS REQUIRED)    Answer:  CRPS    Order Specific Question:  Preferred imaging location?    Answer:  [facility]  . [facility] Drug Analysis, Ur    Volume: 30 ml(s). Minimum 3 ml of urine is needed. Document temperature of fresh sample. Indications: Long term (current) use of opiate analgesic (Z79.891) Test#: [id number] (Comprehensive Profile)  . Comprehensive metabolic panel    Standing Status: Future     Number of Occurrences:      Standing Expiration Date: 04/23/2016    Order Specific Question:  Has the patient fasted?    Answer:  No  . C-reactive protein    Standing Status: Future     Number of Occurrences:      Standing Expiration Date: 04/23/2016  . Magnesium    Standing Status: Future     Number of Occurrences:      Standing Expiration Date: 04/23/2016  . Sedimentation rate    Standing Status: Future     Number of Occurrences:      Standing Expiration Date: 04/23/2016  . Vitamin B12    Standing Status: Future     Number of Occurrences:  Standing Expiration Date: 04/23/2016  . Vitamin D 1,25 dihydroxy    Standing Status: Future     Number of Occurrences:      Standing Expiration Date: 04/23/2016  . Ambulatory referral to Psychology    Referral Priority:  Routine    Referral Type:  Psychiatric    Referral Reason:  Specialty Services Required    Referred to Provider:  Lance Coon, PHD    Requested Specialty:  Psychology     Number of Visits Requested:  1  . Ambulatory referral to Psychology    Referral Priority:  Routine    Referral Type:  Psychiatric    Referral Reason:  Specialty Services Required    Referred to Provider:  Lance Coon, PHD    Requested Specialty:  Psychology    Number of Visits Requested:  1    Imaging Ordered: AMB REFERRAL TO PSYCHOLOGY AMB REFERRAL TO PSYCHOLOGY DG ANKLE COMPLETE LEFT  Interventional Therapies: Scheduled: None at this time. Considering: Lumbar sympathetic radiofrequency ablation, vs. Lumbar Spinal Cord stimulator trial and possible implant. PRN Procedures: None at this time.   Referral(s) or Consult(s): Medical psychology consult for substance use disorder evaluation. In addition, I am requesting an implant evaluation.  Medications administered during this visit: Mr. Szatkowski had no medications administered during this visit.  Prescriptions ordered during this visit: New Prescriptions   No medications on file    No future appointments.  Primary Care Physician: Carylon Perches, MD Location: [facility] Note by: Sydnee Levans. Laban Emperor, M.D, DABA, DABAPM, DABPM, DABIPP, FIPP

## 2016-03-24 NOTE — Progress Notes (Signed)
Safety precautions to be maintained throughout the outpatient stay will include: orient to surroundings, keep bed in low position, maintain call bell within reach at all times, provide assistance with transfer out of bed and ambulation.  

## 2016-03-27 DIAGNOSIS — N23 Unspecified renal colic: Secondary | ICD-10-CM | POA: Diagnosis not present

## 2016-03-30 ENCOUNTER — Ambulatory Visit: Payer: Self-pay | Admitting: Pain Medicine

## 2016-03-30 LAB — COMPLIANCE DRUG ANALYSIS, UR: PDF: 0

## 2016-04-03 MED FILL — TROKENDI XR 50 MG CAPSULE: 50 | 30 days supply | Qty: 30 | Fill #0

## 2016-04-06 ENCOUNTER — Ambulatory Visit (HOSPITAL_COMMUNITY)
Admission: RE | Admit: 2016-04-06 | Discharge: 2016-04-06 | Disposition: A | Discharge status: Home / Self Care | Payer: 59 | Source: Ambulatory Visit | Attending: Internal Medicine | Admitting: Internal Medicine

## 2016-04-06 ENCOUNTER — Other Ambulatory Visit (HOSPITAL_COMMUNITY): Payer: Self-pay | Admitting: Internal Medicine

## 2016-04-06 DIAGNOSIS — Z87442 Personal history of urinary calculi: Secondary | ICD-10-CM | POA: Insufficient documentation

## 2016-04-06 DIAGNOSIS — R1030 Lower abdominal pain, unspecified: Secondary | ICD-10-CM | POA: Insufficient documentation

## 2016-04-17 ENCOUNTER — Other Ambulatory Visit (HOSPITAL_COMMUNITY): Payer: Self-pay | Admitting: Internal Medicine

## 2016-04-17 DIAGNOSIS — N23 Unspecified renal colic: Secondary | ICD-10-CM

## 2016-04-20 ENCOUNTER — Ambulatory Visit (HOSPITAL_COMMUNITY)
Admission: RE | Admit: 2016-04-20 | Discharge: 2016-04-20 | Disposition: A | Discharge status: Home / Self Care | Payer: 59 | Source: Ambulatory Visit | Attending: Internal Medicine | Admitting: Internal Medicine

## 2016-04-20 DIAGNOSIS — N23 Unspecified renal colic: Secondary | ICD-10-CM | POA: Diagnosis not present

## 2016-04-20 DIAGNOSIS — N200 Calculus of kidney: Secondary | ICD-10-CM | POA: Insufficient documentation

## 2016-04-20 DIAGNOSIS — N390 Urinary tract infection, site not specified: Secondary | ICD-10-CM | POA: Diagnosis not present

## 2016-04-20 DIAGNOSIS — R102 Pelvic and perineal pain: Secondary | ICD-10-CM | POA: Diagnosis not present

## 2016-04-25 IMAGING — CT CT ABD-PELV W/ CM
2 of 5 series · 17 of 46 positions shown, 19 images · IV contrast (Omnipaque 300)
Comparison: Abdomen ultrasound obtained earlier today.

CLINICAL DATA: Abdominal pain and vomiting since earlier today.

EXAM:
CT ABDOMEN AND PELVIS WITH CONTRAST
TECHNIQUE: Multidetector CT imaging of the abdomen and pelvis was performed
using the standard protocol following bolus administration of
intravenous contrast.
CONTRAST:  25mL OMNIPAQUE IOHEXOL 300 MG/ML SOLN, 100mL OMNIPAQUE
IOHEXOL 300 MG/ML SOLN

[Series 2: abd_pel_with 5.0 b40f · axial · 0.92mm/px · z∈[-523,-133]mm · 14 of 90 slices shown, 16 images]
[im 6/90  soft-tissue]
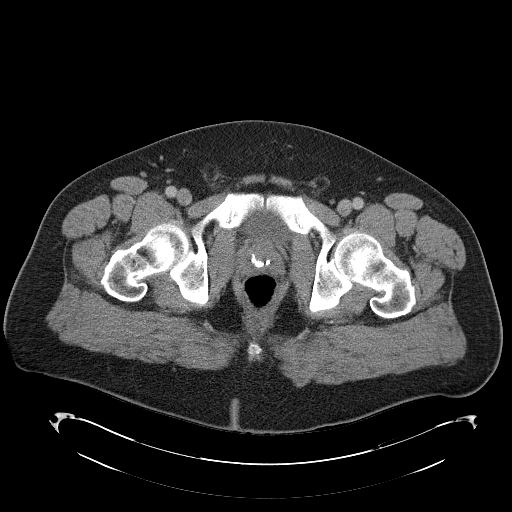
[im 6/90  bone]
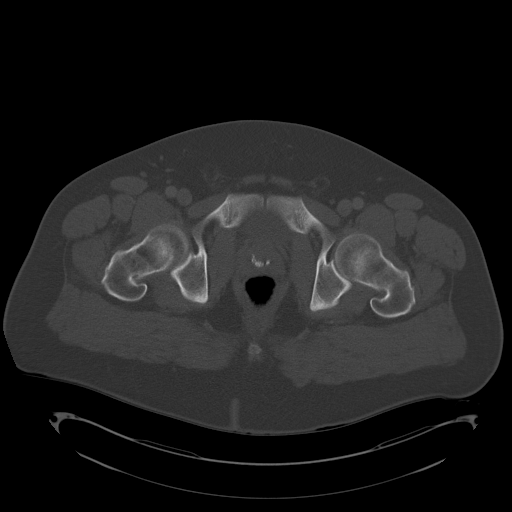
[im 11/90  soft-tissue]
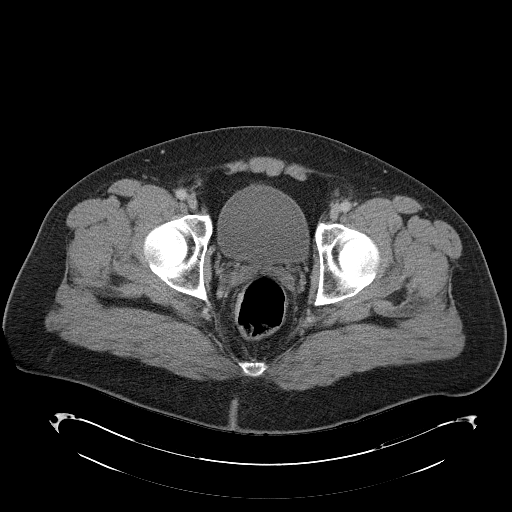
[im 16/90  soft-tissue]
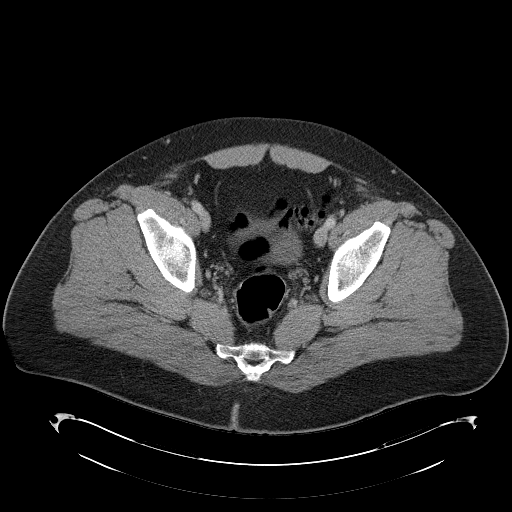
[im 27/90  soft-tissue]
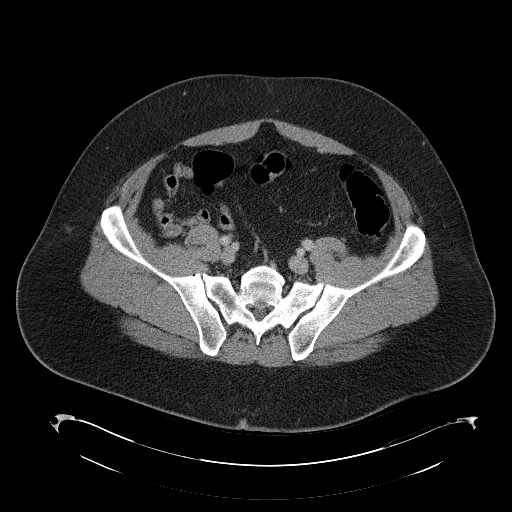
[im 32/90  soft-tissue]
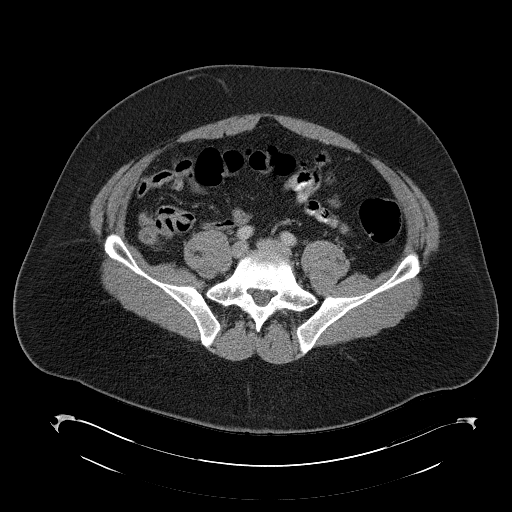
[im 37/90  soft-tissue]
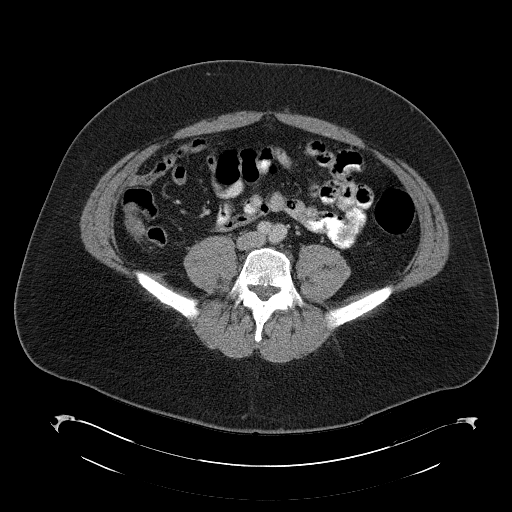
[im 42/90  soft-tissue]
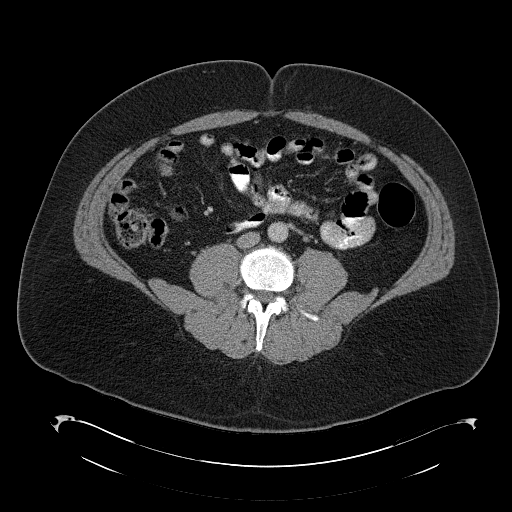
[im 48/90  soft-tissue]
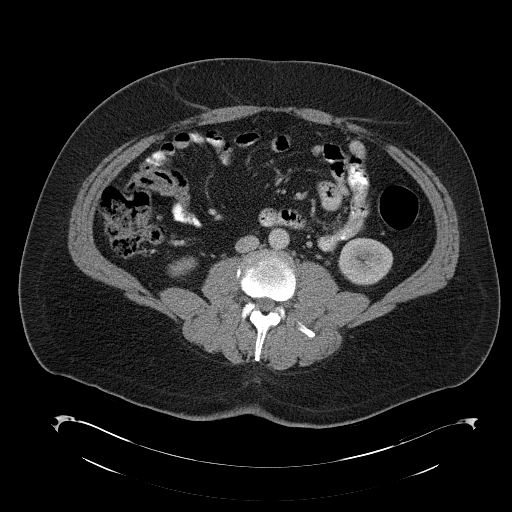
[im 53/90  soft-tissue]
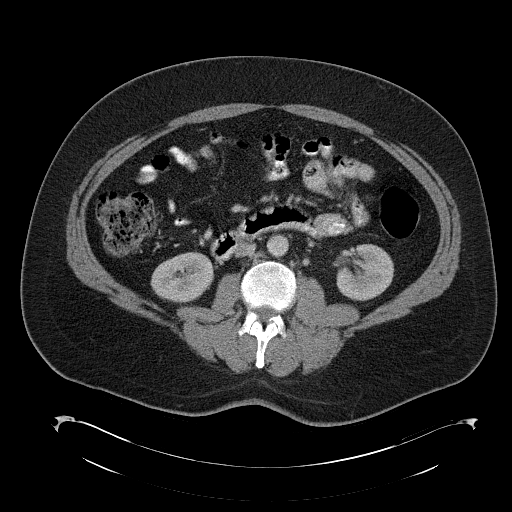
[im 53/90  bone]
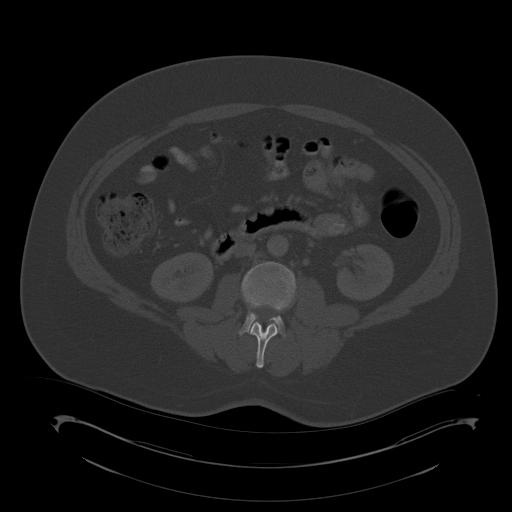
[im 58/90  soft-tissue]
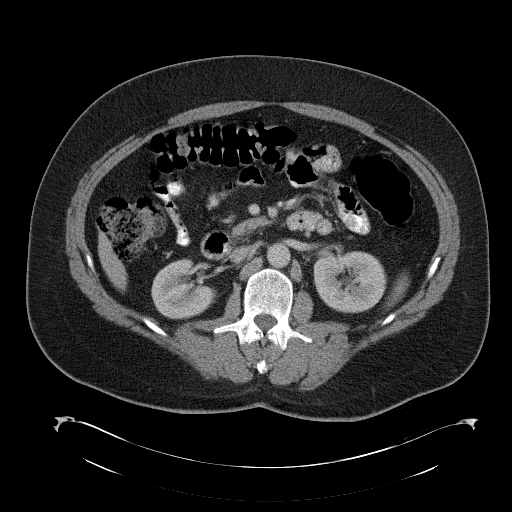
[im 69/90  soft-tissue]
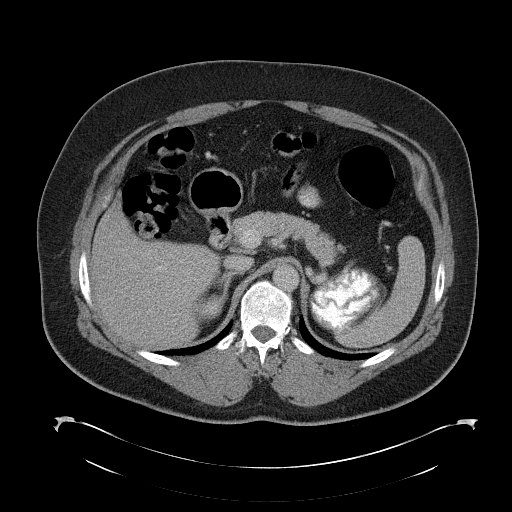
[im 74/90  soft-tissue]
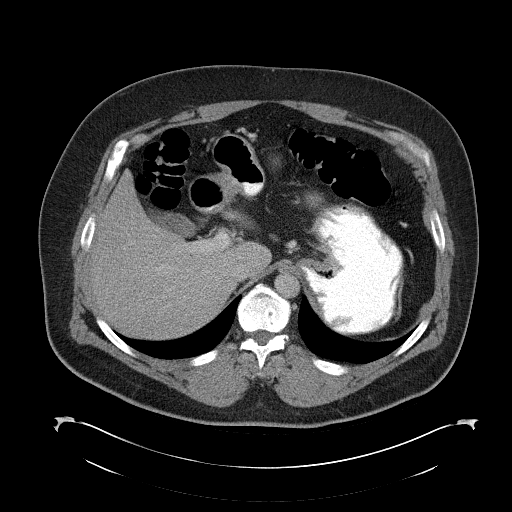
[im 79/90  soft-tissue]
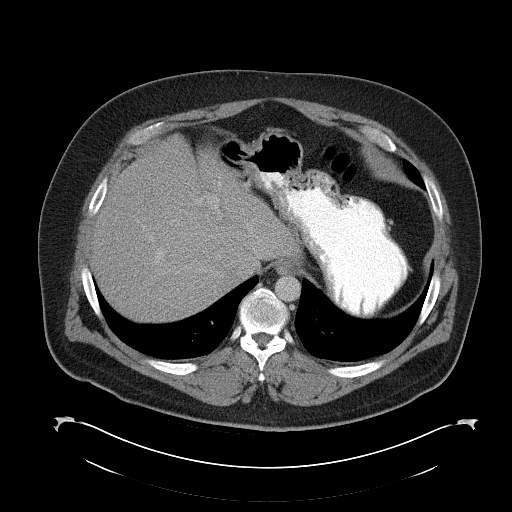
[im 84/90  soft-tissue]
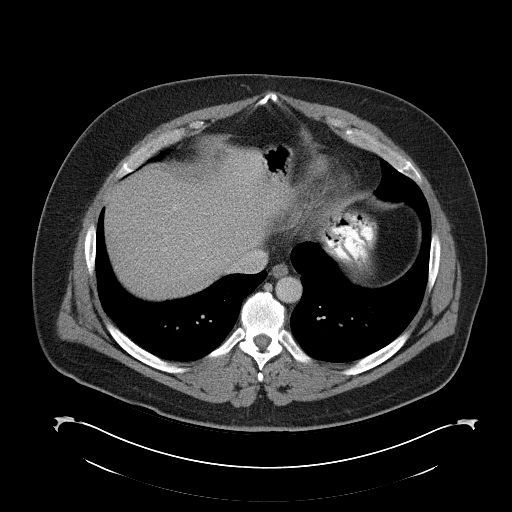

[Series 3: abd_pel_with 3.0 spo cor · coronal · 0.82mm/px · 3 of 98 slices shown]
[im 33/98  soft-tissue]
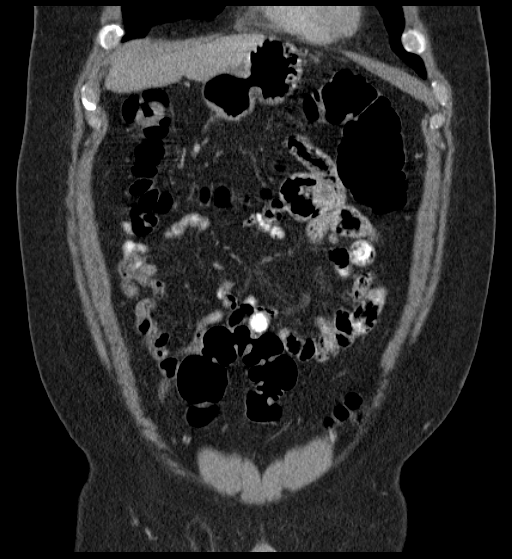
[im 44/98  soft-tissue]
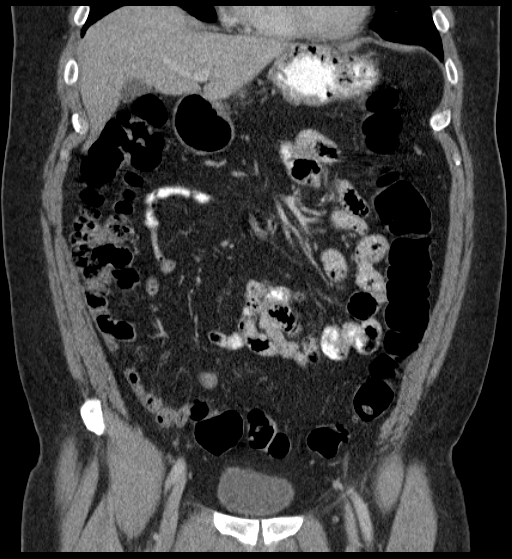
[im 54/98  soft-tissue]
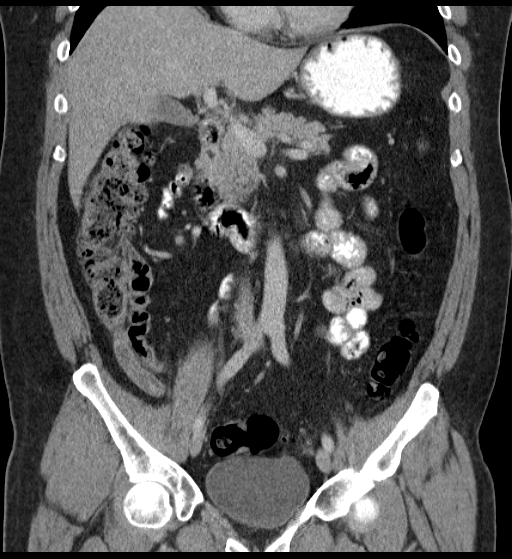

[17 of 46 positions shown; findings below may reference images not displayed]

FINDINGS: 6 mm mid left renal calculus. 5 mm lower pole left renal calculus.
Normal appearing right kidney. No bladder or ureteral calculi and no
hydronephrosis. Normal-sized prostate gland containing coarse
calcifications.

Normal appearing liver, spleen, pancreas, gallbladder and adrenal
glands. Small bilateral inguinal hernias containing fat. Enlarged
appendix containing fluid and demonstrating diffuse wall thickening
and mild periappendiceal soft tissue stranding. The appendix
measures 16 mm in maximum diameter on image number 59. No other
gastrointestinal abnormalities are seen. No enlarged lymph nodes.
Clear lung bases. Lumbar and lower thoracic spine degenerative
changes.
IMPRESSION: 1. Acute appendicitis without abscess.
2. Small bilateral inguinal hernias containing fat.
3. Small, nonobstructing left renal calculi.
These results will be called to the ordering clinician or
representative by the Radiologist Assistant, and communication
documented in the PACS or zVision Dashboard.

## 2016-04-28 DIAGNOSIS — Z0000 Encounter for general adult medical examination without abnormal findings: Secondary | ICD-10-CM | POA: Diagnosis not present

## 2016-04-28 DIAGNOSIS — N200 Calculus of kidney: Secondary | ICD-10-CM | POA: Diagnosis not present

## 2016-04-28 DIAGNOSIS — R3129 Other microscopic hematuria: Secondary | ICD-10-CM | POA: Diagnosis not present

## 2016-04-30 DIAGNOSIS — R030 Elevated blood-pressure reading, without diagnosis of hypertension: Secondary | ICD-10-CM | POA: Diagnosis not present

## 2016-04-30 DIAGNOSIS — R000 Tachycardia, unspecified: Secondary | ICD-10-CM | POA: Diagnosis not present

## 2016-04-30 DIAGNOSIS — R569 Unspecified convulsions: Secondary | ICD-10-CM | POA: Diagnosis not present

## 2016-04-30 DIAGNOSIS — F1193 Opioid use, unspecified with withdrawal: Secondary | ICD-10-CM | POA: Diagnosis not present

## 2016-04-30 DIAGNOSIS — K219 Gastro-esophageal reflux disease without esophagitis: Secondary | ICD-10-CM | POA: Diagnosis not present

## 2016-04-30 DIAGNOSIS — M542 Cervicalgia: Secondary | ICD-10-CM | POA: Diagnosis not present

## 2016-05-06 MED FILL — VIMPAT 50 MG TABLET: 50 | 20 days supply | Qty: 60 | Fill #0

## 2016-05-20 DIAGNOSIS — R1084 Generalized abdominal pain: Secondary | ICD-10-CM | POA: Diagnosis not present

## 2016-05-20 DIAGNOSIS — R3121 Asymptomatic microscopic hematuria: Secondary | ICD-10-CM | POA: Diagnosis not present

## 2016-05-20 DIAGNOSIS — Z0000 Encounter for general adult medical examination without abnormal findings: Secondary | ICD-10-CM | POA: Diagnosis not present

## 2016-06-05 MED FILL — VIMPAT 50 MG TABLET: 50 | 10 days supply | Qty: 30 | Fill #1

## 2016-06-08 DIAGNOSIS — L0390 Cellulitis, unspecified: Secondary | ICD-10-CM | POA: Diagnosis not present

## 2016-06-29 MED FILL — VIMPAT 50 MG TABLET: 50 | 15 days supply | Qty: 60 | Fill #0

## 2016-07-02 DIAGNOSIS — M542 Cervicalgia: Secondary | ICD-10-CM | POA: Diagnosis not present

## 2016-07-02 DIAGNOSIS — K219 Gastro-esophageal reflux disease without esophagitis: Secondary | ICD-10-CM | POA: Diagnosis not present

## 2016-07-02 DIAGNOSIS — R030 Elevated blood-pressure reading, without diagnosis of hypertension: Secondary | ICD-10-CM | POA: Diagnosis not present

## 2016-07-15 MED FILL — VIMPAT 100 MG TABLET: 100 | 30 days supply | Qty: 60 | Fill #0

## 2016-07-30 DIAGNOSIS — K219 Gastro-esophageal reflux disease without esophagitis: Secondary | ICD-10-CM | POA: Diagnosis not present

## 2016-07-30 DIAGNOSIS — F419 Anxiety disorder, unspecified: Secondary | ICD-10-CM | POA: Diagnosis not present

## 2016-07-30 DIAGNOSIS — M1990 Unspecified osteoarthritis, unspecified site: Secondary | ICD-10-CM | POA: Diagnosis not present

## 2016-07-30 DIAGNOSIS — Z79899 Other long term (current) drug therapy: Secondary | ICD-10-CM | POA: Diagnosis not present

## 2016-07-30 DIAGNOSIS — E785 Hyperlipidemia, unspecified: Secondary | ICD-10-CM | POA: Diagnosis not present

## 2016-09-11 DIAGNOSIS — E785 Hyperlipidemia, unspecified: Secondary | ICD-10-CM | POA: Diagnosis not present

## 2016-09-11 DIAGNOSIS — E041 Nontoxic single thyroid nodule: Secondary | ICD-10-CM | POA: Diagnosis not present

## 2016-09-11 DIAGNOSIS — Z23 Encounter for immunization: Secondary | ICD-10-CM | POA: Diagnosis not present

## 2016-09-15 ENCOUNTER — Other Ambulatory Visit (HOSPITAL_COMMUNITY): Payer: Self-pay | Admitting: Internal Medicine

## 2016-09-15 ENCOUNTER — Other Ambulatory Visit: Payer: Self-pay | Admitting: Internal Medicine

## 2016-09-15 DIAGNOSIS — E041 Nontoxic single thyroid nodule: Secondary | ICD-10-CM

## 2016-09-17 ENCOUNTER — Ambulatory Visit (HOSPITAL_COMMUNITY)
Admission: RE | Admit: 2016-09-17 | Discharge: 2016-09-17 | Disposition: A | Discharge status: Home / Self Care | Payer: 59 | Source: Ambulatory Visit | Attending: Internal Medicine | Admitting: Internal Medicine

## 2016-09-17 DIAGNOSIS — E041 Nontoxic single thyroid nodule: Secondary | ICD-10-CM

## 2016-09-17 DIAGNOSIS — E010 Iodine-deficiency related diffuse (endemic) goiter: Secondary | ICD-10-CM | POA: Diagnosis not present

## 2016-10-02 DIAGNOSIS — F339 Major depressive disorder, recurrent, unspecified: Secondary | ICD-10-CM | POA: Diagnosis not present

## 2016-10-02 DIAGNOSIS — F419 Anxiety disorder, unspecified: Secondary | ICD-10-CM | POA: Diagnosis not present

## 2016-10-05 MED FILL — VIMPAT 100 MG TABLET: 100 | 30 days supply | Qty: 60 | Fill #1
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
## 2016-10-20 ENCOUNTER — Encounter (HOSPITAL_COMMUNITY): Payer: Self-pay | Admitting: Psychiatry

## 2016-10-20 ENCOUNTER — Ambulatory Visit (INDEPENDENT_AMBULATORY_CARE_PROVIDER_SITE_OTHER): Payer: 59 | Admitting: Psychiatry

## 2016-10-20 VITALS — BP 144/89 | HR 75 | Ht 72.0 in | Wt 214.0 lb

## 2016-10-20 DIAGNOSIS — Z79899 Other long term (current) drug therapy: Secondary | ICD-10-CM

## 2016-10-20 DIAGNOSIS — F17210 Nicotine dependence, cigarettes, uncomplicated: Secondary | ICD-10-CM | POA: Diagnosis not present

## 2016-10-20 DIAGNOSIS — Z8249 Family history of ischemic heart disease and other diseases of the circulatory system: Secondary | ICD-10-CM | POA: Diagnosis not present

## 2016-10-20 DIAGNOSIS — F331 Major depressive disorder, recurrent, moderate: Secondary | ICD-10-CM | POA: Insufficient documentation

## 2016-10-20 DIAGNOSIS — Z8489 Family history of other specified conditions: Secondary | ICD-10-CM | POA: Diagnosis not present

## 2016-10-20 MED ORDER — HYDROXYZINE HCL 25 MG PO TABS: 25.0000 mg | ORAL_TABLET | Freq: Three times a day (TID) | ORAL | 0 refills | Status: DC | PRN

## 2016-10-20 MED ORDER — SERTRALINE HCL 100 MG PO TABS: 100.0000 mg | ORAL_TABLET | Freq: Every day | ORAL | 0 refills | Status: DC

## 2016-10-20 MED ORDER — MIRTAZAPINE 7.5 MG PO TABS: 7.5000 mg | ORAL_TABLET | Freq: Every day | ORAL | 0 refills | Status: DC

## 2016-10-20 NOTE — Patient Instructions (Addendum)
1. Increase sertraline 100 mg daily 2. Start mirtazapine 7.5 mg at night 3. Continue Ativan 1 mg three times a day as needed for anxiety 4. Start hydroxyzine 25 mg three times a day as needed for anxiety 5. Return to clinic in a month 6. Referral for therapy

## 2016-10-28 DIAGNOSIS — F339 Major depressive disorder, recurrent, unspecified: Secondary | ICD-10-CM | POA: Diagnosis not present
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
## 2016-11-19 ENCOUNTER — Ambulatory Visit (INDEPENDENT_AMBULATORY_CARE_PROVIDER_SITE_OTHER): Payer: 59 | Admitting: Psychiatry

## 2016-11-19 VITALS — BP 137/80 | HR 77 | Wt 210.0 lb

## 2016-11-19 DIAGNOSIS — Z8489 Family history of other specified conditions: Secondary | ICD-10-CM | POA: Diagnosis not present

## 2016-11-19 DIAGNOSIS — Z8249 Family history of ischemic heart disease and other diseases of the circulatory system: Secondary | ICD-10-CM

## 2016-11-19 DIAGNOSIS — Z9889 Other specified postprocedural states: Secondary | ICD-10-CM | POA: Diagnosis not present

## 2016-11-19 DIAGNOSIS — F331 Major depressive disorder, recurrent, moderate: Secondary | ICD-10-CM | POA: Diagnosis not present

## 2016-11-19 DIAGNOSIS — F17210 Nicotine dependence, cigarettes, uncomplicated: Secondary | ICD-10-CM

## 2016-11-19 DIAGNOSIS — Z79899 Other long term (current) drug therapy: Secondary | ICD-10-CM

## 2016-11-19 MED ORDER — SERTRALINE HCL 100 MG PO TABS: 100.0000 mg | ORAL_TABLET | Freq: Every day | ORAL | 1 refills | Status: DC

## 2016-11-19 MED ORDER — TRAZODONE HCL 50 MG PO TABS: 50.0000 mg | ORAL_TABLET | Freq: Every evening | ORAL | 1 refills | Status: DC | PRN

## 2016-11-19 NOTE — Patient Instructions (Signed)
1. Increase sertraline 150 mg daily 2. Discontinue hydroxyzine, mirtazapine 3. Start Trazodone 25-50 mg at night as needed for sleep 4 Continue lorazepam 1 mg three times a day as needed for anxiety 5. Return to clinic in January

## 2016-12-02 DIAGNOSIS — R945 Abnormal results of liver function studies: Secondary | ICD-10-CM | POA: Diagnosis not present

## 2016-12-04 MED FILL — VIMPAT 100 MG TABLET: 100 | 30 days supply | Qty: 60 | Fill #2

## 2016-12-09 DIAGNOSIS — K610 Anal abscess: Secondary | ICD-10-CM | POA: Diagnosis not present

## 2016-12-09 DIAGNOSIS — K603 Anal fistula: Secondary | ICD-10-CM | POA: Diagnosis not present

## 2016-12-16 ENCOUNTER — Encounter (HOSPITAL_COMMUNITY): Payer: Self-pay | Admitting: Psychiatry

## 2016-12-16 ENCOUNTER — Ambulatory Visit (INDEPENDENT_AMBULATORY_CARE_PROVIDER_SITE_OTHER): Payer: 59 | Admitting: Psychiatry

## 2016-12-16 DIAGNOSIS — F331 Major depressive disorder, recurrent, moderate: Secondary | ICD-10-CM
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
## 2017-01-06 ENCOUNTER — Ambulatory Visit (HOSPITAL_COMMUNITY): Payer: Self-pay | Admitting: Psychiatry

## 2017-01-07 ENCOUNTER — Ambulatory Visit (HOSPITAL_COMMUNITY): Payer: Self-pay | Admitting: Psychiatry

## 2017-01-08 DIAGNOSIS — L02422 Furuncle of left axilla: Secondary | ICD-10-CM | POA: Diagnosis not present

## 2017-01-08 DIAGNOSIS — L02421 Furuncle of right axilla: Secondary | ICD-10-CM | POA: Diagnosis not present

## 2017-01-11 DIAGNOSIS — L02422 Furuncle of left axilla: Secondary | ICD-10-CM | POA: Diagnosis not present

## 2017-01-20 ENCOUNTER — Ambulatory Visit (INDEPENDENT_AMBULATORY_CARE_PROVIDER_SITE_OTHER): Payer: 59 | Admitting: Psychiatry

## 2017-01-20 ENCOUNTER — Encounter (HOSPITAL_COMMUNITY): Payer: Self-pay | Admitting: Psychiatry

## 2017-01-20 DIAGNOSIS — F331 Major depressive disorder, recurrent, moderate: Secondary | ICD-10-CM
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
## 2017-01-21 ENCOUNTER — Ambulatory Visit (INDEPENDENT_AMBULATORY_CARE_PROVIDER_SITE_OTHER): Payer: 59 | Admitting: Psychiatry

## 2017-01-21 ENCOUNTER — Encounter (HOSPITAL_COMMUNITY): Payer: Self-pay | Admitting: Psychiatry

## 2017-01-21 VITALS — BP 104/71 | HR 77 | Ht 72.0 in | Wt 206.4 lb

## 2017-01-21 DIAGNOSIS — F331 Major depressive disorder, recurrent, moderate: Secondary | ICD-10-CM

## 2017-01-21 DIAGNOSIS — Z8489 Family history of other specified conditions: Secondary | ICD-10-CM | POA: Diagnosis not present

## 2017-01-21 DIAGNOSIS — Z9889 Other specified postprocedural states: Secondary | ICD-10-CM

## 2017-01-21 DIAGNOSIS — Z818 Family history of other mental and behavioral disorders: Secondary | ICD-10-CM

## 2017-01-21 DIAGNOSIS — Z8249 Family history of ischemic heart disease and other diseases of the circulatory system: Secondary | ICD-10-CM | POA: Diagnosis not present

## 2017-01-21 DIAGNOSIS — F17210 Nicotine dependence, cigarettes, uncomplicated: Secondary | ICD-10-CM

## 2017-01-21 DIAGNOSIS — Z79899 Other long term (current) drug therapy: Secondary | ICD-10-CM

## 2017-01-21 MED ORDER — SERTRALINE HCL 100 MG PO TABS: 100.0000 mg | ORAL_TABLET | Freq: Every day | ORAL | 1 refills | Status: AC
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
## 2017-01-21 NOTE — Patient Instructions (Signed)
1. Continue sertraline 100 mg daily 2. Return to clinic in one month 

## 2017-02-11 ENCOUNTER — Telehealth (HOSPITAL_COMMUNITY): Payer: Self-pay | Admitting: *Deleted

## 2017-02-11 NOTE — Telephone Encounter (Signed)
left voice message regarding provider not available 02/18/17. 

## 2017-02-15 ENCOUNTER — Ambulatory Visit (INDEPENDENT_AMBULATORY_CARE_PROVIDER_SITE_OTHER): Payer: 59 | Admitting: Psychiatry

## 2017-02-15 DIAGNOSIS — F331 Major depressive disorder, recurrent, moderate: Secondary | ICD-10-CM
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
## 2017-02-16 DIAGNOSIS — F339 Major depressive disorder, recurrent, unspecified: Secondary | ICD-10-CM | POA: Diagnosis not present
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
## 2017-02-18 ENCOUNTER — Ambulatory Visit (HOSPITAL_COMMUNITY): Payer: Self-pay | Admitting: Psychiatry

## 2017-03-12 ENCOUNTER — Telehealth (HOSPITAL_COMMUNITY): Payer: Self-pay | Admitting: *Deleted

## 2017-03-12 NOTE — Telephone Encounter (Signed)
Voice message from patient to cancel next few appointments due to insurance.

## 2017-03-15 ENCOUNTER — Ambulatory Visit (HOSPITAL_COMMUNITY): Payer: Self-pay | Admitting: Psychiatry

## 2017-03-15 NOTE — Telephone Encounter (Signed)
noted 

## 2017-03-29 ENCOUNTER — Ambulatory Visit (HOSPITAL_COMMUNITY): Payer: Self-pay | Admitting: Psychiatry
